# Patient Record
Sex: Female | Born: 1960 | Race: White | Hispanic: No | Marital: Married | Smoking: Never smoker
Health system: Southern US, Community
[De-identification: ages and names within clinical notes are randomized; demographics above are authoritative.]

## PROBLEM LIST (undated history)

## (undated) DIAGNOSIS — F329 Major depressive disorder, single episode, unspecified: Secondary | ICD-10-CM

## (undated) DIAGNOSIS — E785 Hyperlipidemia, unspecified: Secondary | ICD-10-CM

## (undated) DIAGNOSIS — F32A Depression, unspecified: Secondary | ICD-10-CM

## (undated) DIAGNOSIS — E079 Disorder of thyroid, unspecified: Secondary | ICD-10-CM

## (undated) HISTORY — DX: Hyperlipidemia, unspecified: E78.5

## (undated) HISTORY — DX: Depression, unspecified: F32.A

## (undated) HISTORY — PX: CERVICAL BIOPSY  W/ LOOP ELECTRODE EXCISION: SUR135

## (undated) HISTORY — DX: Major depressive disorder, single episode, unspecified: F32.9

## (undated) HISTORY — DX: Disorder of thyroid, unspecified: E07.9

---

## 1997-08-15 ENCOUNTER — Other Ambulatory Visit: Admission: RE | Admit: 1997-08-15 | Discharge: 1997-08-15 | Payer: Self-pay | Admitting: Obstetrics and Gynecology

## 1998-02-22 ENCOUNTER — Inpatient Hospital Stay (HOSPITAL_COMMUNITY): Admission: AD | Admit: 1998-02-22 | Discharge: 1998-02-24 | Payer: Self-pay | Admitting: Obstetrics and Gynecology

## 1998-11-23 ENCOUNTER — Other Ambulatory Visit: Admission: RE | Admit: 1998-11-23 | Discharge: 1998-11-23 | Payer: Self-pay | Admitting: *Deleted

## 1999-03-11 HISTORY — PX: TUBAL LIGATION: SHX77

## 1999-05-28 ENCOUNTER — Inpatient Hospital Stay (HOSPITAL_COMMUNITY): Admission: AD | Admit: 1999-05-28 | Discharge: 1999-05-29 | Payer: Self-pay | Admitting: Obstetrics and Gynecology

## 1999-09-03 ENCOUNTER — Ambulatory Visit (HOSPITAL_COMMUNITY): Admission: RE | Admit: 1999-09-03 | Discharge: 1999-09-03 | Payer: Self-pay | Admitting: Obstetrics and Gynecology

## 2000-02-28 ENCOUNTER — Other Ambulatory Visit: Admission: RE | Admit: 2000-02-28 | Discharge: 2000-02-28 | Payer: Self-pay | Admitting: Obstetrics and Gynecology

## 2003-06-26 ENCOUNTER — Other Ambulatory Visit: Admission: RE | Admit: 2003-06-26 | Discharge: 2003-06-26 | Payer: Self-pay | Admitting: Family Medicine

## 2003-11-17 ENCOUNTER — Encounter: Admission: RE | Admit: 2003-11-17 | Discharge: 2003-11-17 | Payer: Self-pay | Admitting: Family Medicine

## 2003-12-21 ENCOUNTER — Other Ambulatory Visit: Admission: RE | Admit: 2003-12-21 | Discharge: 2003-12-21 | Payer: Self-pay | Admitting: Family Medicine

## 2004-01-03 ENCOUNTER — Other Ambulatory Visit: Admission: RE | Admit: 2004-01-03 | Discharge: 2004-01-03 | Payer: Self-pay | Admitting: Obstetrics and Gynecology

## 2004-04-16 ENCOUNTER — Ambulatory Visit (HOSPITAL_COMMUNITY): Admission: RE | Admit: 2004-04-16 | Discharge: 2004-04-16 | Payer: Self-pay | Admitting: Obstetrics and Gynecology

## 2004-04-16 ENCOUNTER — Encounter (INDEPENDENT_AMBULATORY_CARE_PROVIDER_SITE_OTHER): Payer: Self-pay | Admitting: Specialist

## 2004-11-27 ENCOUNTER — Other Ambulatory Visit: Admission: RE | Admit: 2004-11-27 | Discharge: 2004-11-27 | Payer: Self-pay | Admitting: Obstetrics and Gynecology

## 2005-05-26 ENCOUNTER — Emergency Department (HOSPITAL_COMMUNITY): Admission: EM | Admit: 2005-05-26 | Discharge: 2005-05-26 | Payer: Self-pay | Admitting: Emergency Medicine

## 2005-11-13 ENCOUNTER — Encounter: Admission: RE | Admit: 2005-11-13 | Discharge: 2005-11-13 | Payer: Self-pay | Admitting: Family Medicine

## 2006-06-17 ENCOUNTER — Other Ambulatory Visit: Admission: RE | Admit: 2006-06-17 | Discharge: 2006-06-17 | Payer: Self-pay | Admitting: Family Medicine

## 2006-11-25 IMAGING — CR DG FACIAL BONES COMPLETE 3+V
7 series · 7 of 7 positions shown · non-contrast
Comparison: None.

CLINICAL DATA: Fell injuring left side of face. 
 FACIAL BONES ? 5 VIEW:

[[person_name]]
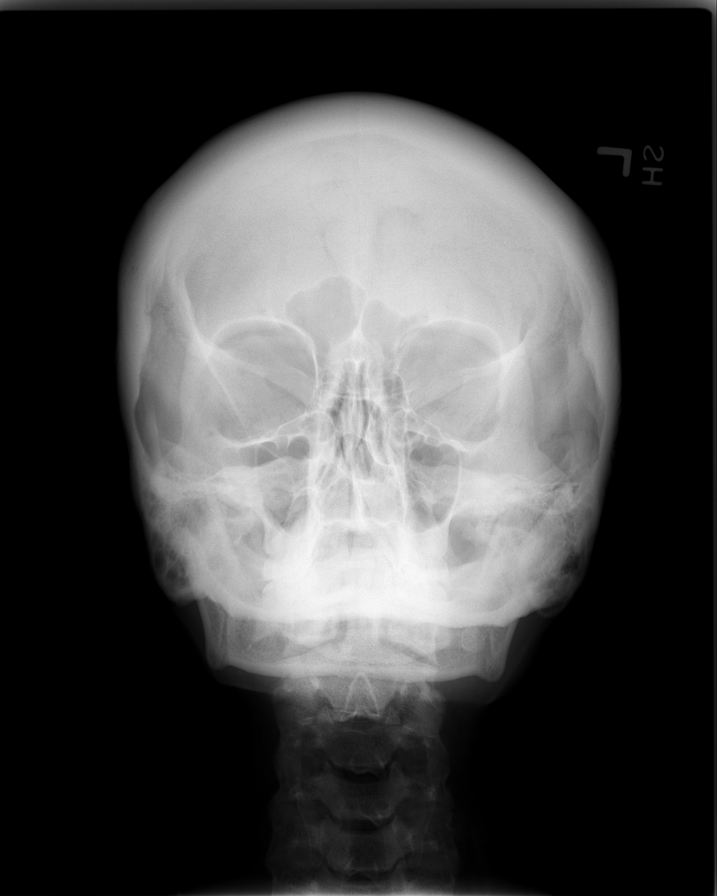

[w waters]
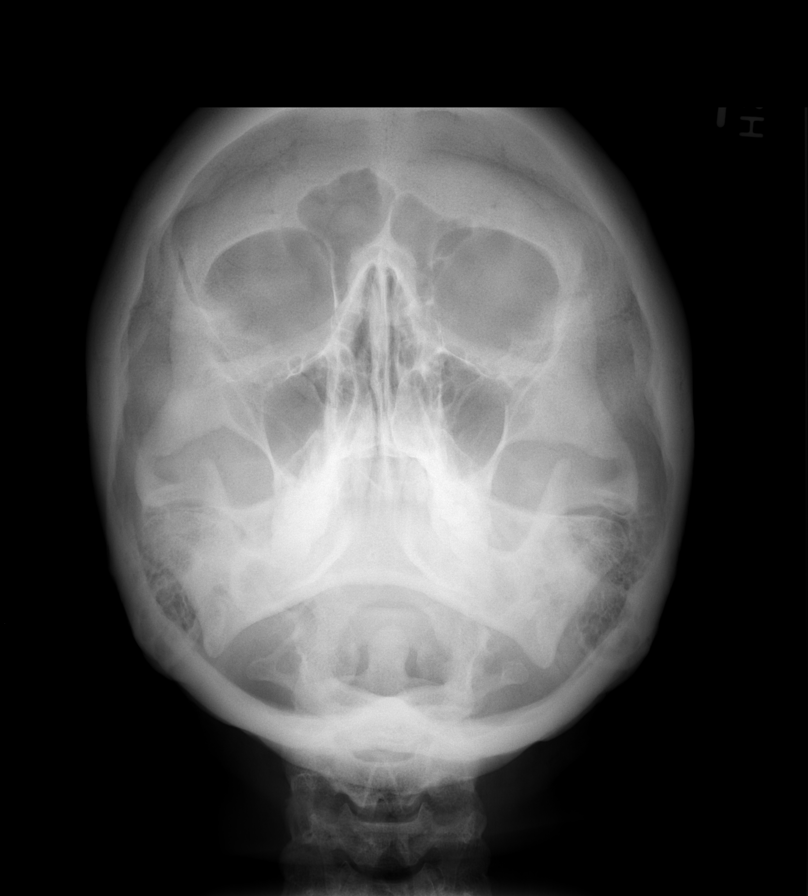

[w skull lat]
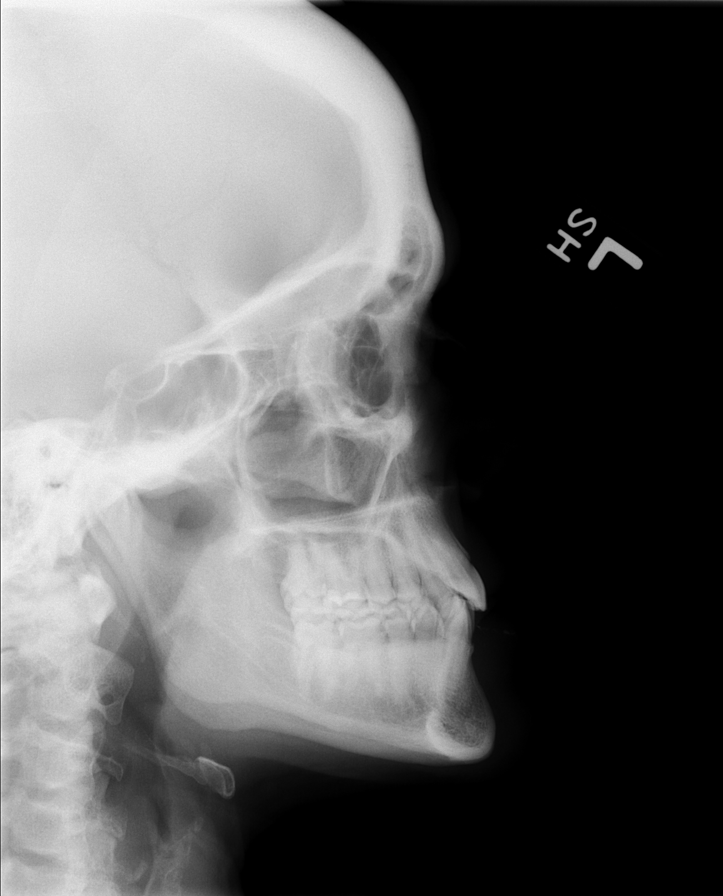

[w smv (1 of 3)]
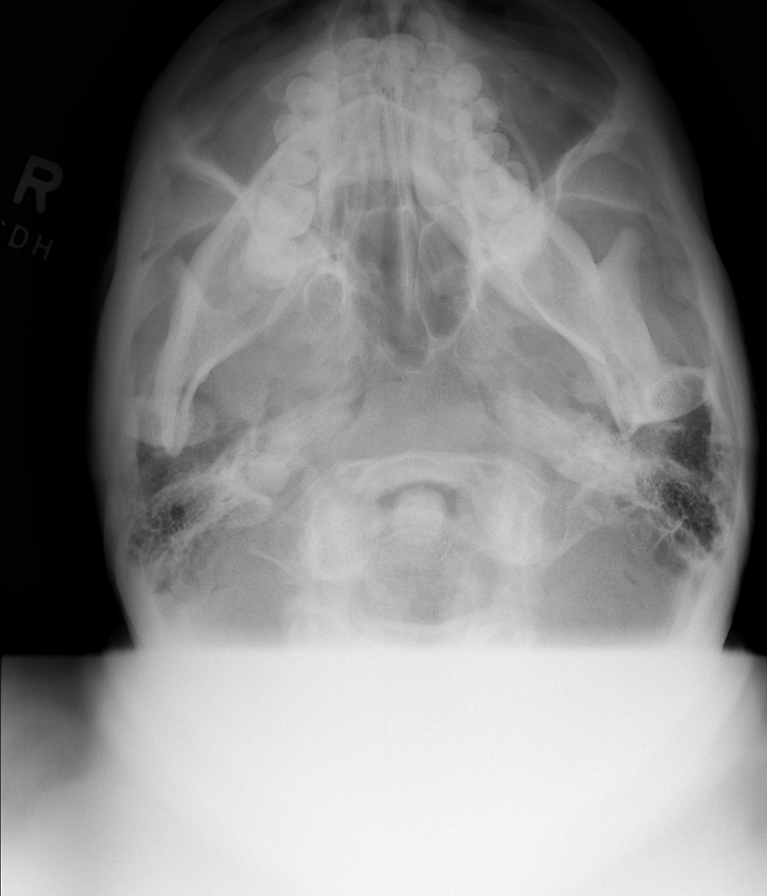

[w smv (2 of 3)]
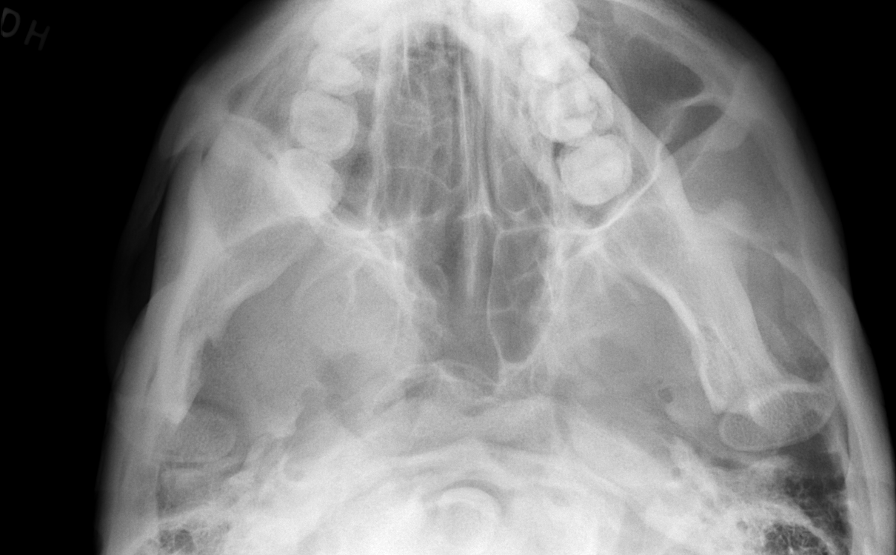

[w smv (3 of 3)]
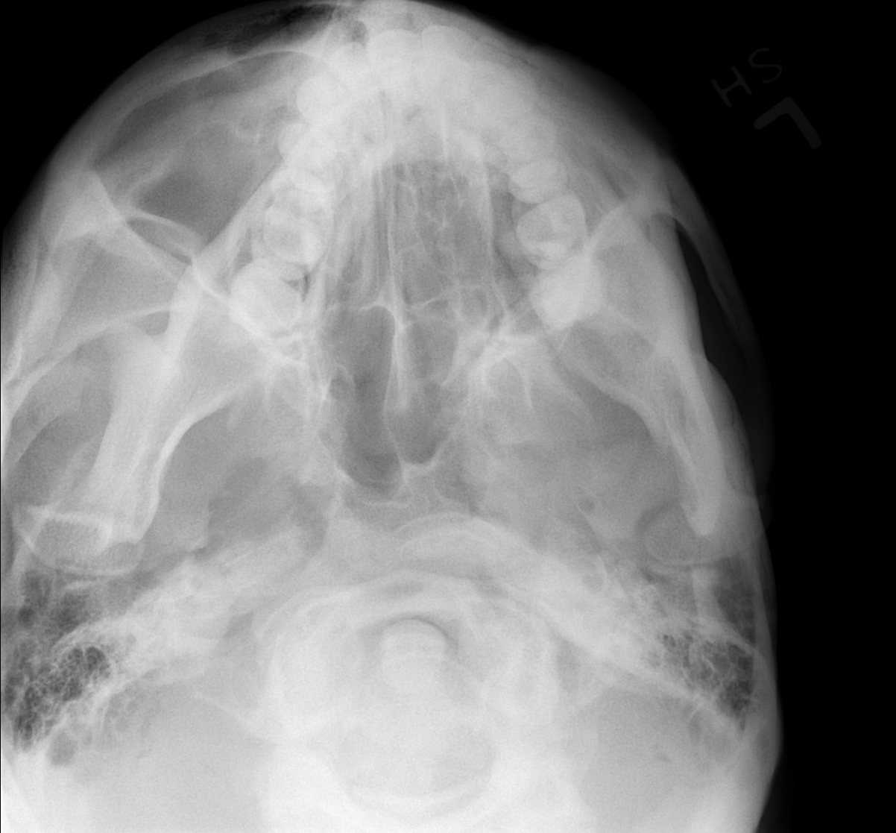

[w waters *]
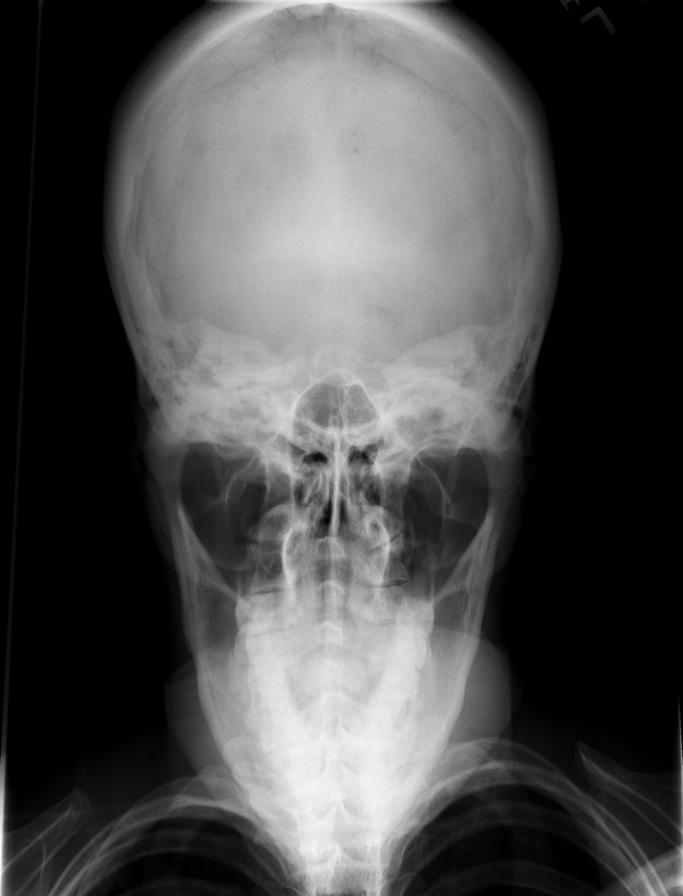

[7 of 7 positions shown; findings below may reference images not displayed]

FINDINGS: no obvious facial fractures.  No orbital emphysema. No fluid in the sinuses.
IMPRESSION: Normal.

## 2006-12-24 ENCOUNTER — Other Ambulatory Visit: Admission: RE | Admit: 2006-12-24 | Discharge: 2006-12-24 | Payer: Self-pay | Admitting: Family Medicine

## 2009-11-06 ENCOUNTER — Other Ambulatory Visit: Admission: RE | Admit: 2009-11-06 | Discharge: 2009-11-06 | Payer: Self-pay | Admitting: Family Medicine

## 2010-07-26 NOTE — H&P (Signed)
[facility]  Patient:    Vanessa Farrell, Vanessa Farrell                   MRN: [id number] Adm. Date:  [id number] Attending:  Leonard Schwartz Dictator:   Vance Gather Duplantis, C.N.M.                         History and Physical  HISTORY OF PRESENT ILLNESS:   Ms. Goodroe is a 50 year old married white female, gravida 5, para 4-0-0-4, who presents at 41-2/7 weeks complaining of uterine contractions every five minutes for the last several hours.  She denies any leaking or vaginal bleeding.  She reports positive fetal movement.  Her pregnancy has been followed at [facility] by the certified nurse midwife service and as been essentially uncomplicated though at risk for:  #1 - A history of advanced maternal age with no amniocentesis, #2 - history of rapid labor.  Her group B strep is negative.  OB/GYN HISTORY:               She is a gravida 5, para 4-0-0-4 who delivered a viable female infant in 60 who weighed 7 pounds 12 ounces at 40-weeks gestation  following a 12-hour labor without complication.  In 1993, she delivered another  viable female infant who weighed 7 pounds 12 ounces at 40-weeks gestation following a four-hour labor, also without complication.  In 1997, she delivered a viable female infant who weighed 7-1/2 pounds at 40-weeks gestation following a three-hour labor without complication except meconium-stained fluid and in 1999, she delivered a  viable female infant who weighed 9 pounds 6 ounces at 40-weeks gestation following a four-hour labor, also without complication.  ALLERGIES:                    She has no known drug allergies.  GENERAL MEDICAL HISTORY:      She reports having had the usual childhood diseases. She reports a history of anemia with previous pregnancies.  FAMILY HISTORY:               Her family history if significant for father, maternal grandmother and paternal grandmother with MIs, varicose veins with  her  mother, paternal grandmother with diabetes, mother with thyroidectomy, mother with lymphatic cancer, now treated with radiation.  GENETIC HISTORY:              Her genetic history is significant only for the fact that she is age 37 and declines an amniocentesis.  SOCIAL HISTORY:               She is married to Marisue Ivan, who is involved and supportive.  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 They are of the [facility] faith.  They deny any illicit drug use, alcohol or smoking with this pregnancy.  PRENATAL LABORATORY DATA:     Her blood type is O-positive.  Her antibody screen is negative.  Syphilis is nonreactive.  Rubella is immune.  Hepatitis B surface antigen is negative.  Pap is within normal limits.  One-hour glucola was elevated but her three-hour GTT was within normal limits.  Her 36-week beta strep was negative.  PHYSICAL EXAMINATION:  VITAL SIGNS:                  Her vital signs are stable.  She is afebrile.  HEENT:  Grossly within normal limits.  HEART:                        Regular rate and rhythm.  CHEST:                        Clear.  BREASTS:                      Soft and nontender.  ABDOMEN:                      Gravid with uterine contractions noted every three to five minutes.  Her fetal heart rate is reactive and reassuring.  PELVIC:                       Her pelvic exam on admission is 4 cm, 80% and vertex at a -1 station with intact membranes.  EXTREMITIES:                  Within normal limits.  ASSESSMENT:                   1. Intrauterine pregnancy at term.                               2. Active labor.                               3. Negative group B streptococcus.  PLAN:                         Her plan is to admit to labor and delivery, to follow routine C.N.M. orders and to notify Dr. Maris Berger. Haygood of patients admission. DD:  05/28/99 TD:  05/29/99 Job:  [id number]

## 2010-07-26 NOTE — H&P (Signed)
Vanessa Farrell, Vanessa Farrell            ACCOUNT NO.:  [id number]   MEDICAL RECORD NO.:  [id number]          PATIENT TYPE:  AMB   LOCATION:  SDC                           [facility]ILITY:  [facility]   PHYSICIAN:  Charles A. Delcambre, MDDATE OF BIRTH:  Feb 10, 1961   DATE OF ADMISSION:  DATE OF DISCHARGE:                                HISTORY & PHYSICAL   HISTORY:  A 50 year old para 5-0-0-5 admitted for an abnormal pap smear.  In  2003 she had and atypical smear and colposcopy with at 9:00 mild dysplasia.  ECC benign.  In 2004 atypical pap.  In 2004 low grade pap.  In April 2005  low grade pap.  In October 2005 high grade pap, CIN-II.  A colposcopy was  done with no lesions seen.  High risk HPV positive.  ECC scattered  dysplastic cells noted within the cervical mucosa.  An 11:00 biopsy random  showing low grade dysplasia.  She does have a tubal ligation and her husband  has had two vasectomies.   MEDICAL HISTORY:  1.  Anxiety.  2.  ADD.  3.  Hypothyroidism.   SURGICAL HISTORY:  1.  Tubal ligation.  2.  LASIK surgery.   MEDICATIONS:  1.  Effexor XR 35 mg once a day.  2.  Strattera 60 mg once a day.  3.  Synthroid dose not specified.   ALLERGIES:  No known drug allergies.   SOCIAL HISTORY:  No tobacco, ethanol, or drug use.  No SST exposure in the  past that she is aware of.  She is married and in a monogamous relationship  with her husband.   FAMILY HISTORY:  Father died at age 48 with coronary artery disease.  Mother  is 37.  Her brother is 72.  A sister is 30.  Otherwise no major illnesses.   REVIEW OF SYSTEMS:  No fever, chills, rashes, lesions, headaches, dizziness,  seasonal allergies, chest pain, shortness of breath, wheezing, constipation,  bleeding, diarrhea, melena, hematochezia, urgency, frequency, dysuria,  incontinency, hematuria, _____________ or emotional changes.   PHYSICAL EXAMINATION:  GENERAL:  Alert and oriented x3.  No distress.  VITAL SIGNS:  Blood pressure 120/78.   Weight 142 pounds.  Heart rate is 72.  Respirations 18.  HEENT:  Grossly within normal limits.  NECK:  Supple without thyromegaly or adenopathy.  LUNGS:  Clear bilaterally.  HEART:  Regular rate and rhythm without murmurs, rubs, or gallops.  BREASTS:  Deferred. Personal exam with PCP recently within the last several  months.  ABDOMEN:  Flat, soft, and nontender.  No hepatosplenomegaly or masses noted.  PELVIC:  Normal external female genitalia.  Bartholin's, urethra, and  Skene's within normal limits.  Vulva without discharge or lesions.  Multiparous cervix is noted.  Bimanual examination of the uterus anteverted,  six weeks size.  Adnexa nontender without masses bilaterally.  Ovaries  palpable and normal size bilaterally.   ASSESSMENT:  High grade dysplasia Pap.  Lesion not found on colposcopy.  No  lesions seen.  Biopsy low grade lesion.  Positive endocervical curettage.  Patient admitted for cold knife conization.   PLAN:  NPO past midnight the evening prior to surgery.  Preoperative CBC.  Status post BTL and vasectomy, we will forego getting HCG pregnancy test  done.  She accepts the risks of infection, bleeding, bowel, and bladder  damage, and risks of blood products to include hepatitis and HIV exposure.  All questions were answered and we will proceed as outlined.      CAD/MEDQ  D:  04/10/2004  T:  04/10/2004  Job:  [id number]

## 2010-07-26 NOTE — Op Note (Signed)
Vanessa Farrell, CRAMER            ACCOUNT NO.:  [id number]   MEDICAL RECORD NO.:  [id number]          PATIENT TYPE:  AMB   LOCATION:  SDC                           [facility]:  [facility]   PHYSICIAN:  Charles A. Delcambre, MDDATE OF BIRTH:  10-26-1960   DATE OF PROCEDURE:  04/16/2004  DATE OF DISCHARGE:                                 OPERATIVE REPORT   PREOPERATIVE DIAGNOSES:  1.  High-grade Papanicolaou smear.  2.  Inadequate colposcopy.  3.  Positive endocervical curettings.  4.  No lesion seen on colposcopy.   POSTOPERATIVE DIAGNOSES:  1.  High-grade Papanicolaou smear.  2.  Inadequate colposcopy.  3.  Positive endocervical curettings.  4.  No lesion seen on colposcopy.   PROCEDURES:  1.  Cold knife conization.  2.  Paracervical block.   SURGEON:  Charles A. Delcambre, MD   ASSISTANT:  None.   COMPLICATIONS:  None.   ESTIMATED BLOOD LOSS:  75 mL.   SPECIMENS:  Cone specimen, open at 12 o'clock.   ANESTHESIA:  General mask and IV sedation.   Instrument and sponge and needle count correct x2.   DESCRIPTION OF PROCEDURE:  The patient was taken to the operating room and  placed in supine position.  Anesthesia was dosed without difficulty.  The  patient was then placed in the dorsal lithotomy position, sterile prep and  drape was undertaken.  A weighted speculum was placed in the vagina.  The  cervix was grasped with a single-tooth tenaculum.  A paracervical block of  0.25% Marcaine with epinephrine was placed, a total of 10 mL divided at 4  and 8 o'clock.  Lugol's solution was placed.  A small amount of Lugol's  solution was noted faintly at 7 o'clock.  The cone specimen was excised with  a knife, encompassing this lesion widely.  A deep cone was taken.  A 0  Vicryl running suture was then placed around the cervical mucosal edge.  Several figure-of-eight 2-0 Vicryl sutures were  placed in the cone bed.  Electrocautery was used to achieve hemostasis.  Hemostasis was  adequate.  Monsel's solution was held in pressure.  Hemostasis was verified.  All the instruments were removed.  The patient was  taken to recovery with physician in attendance, having tolerated the  procedure well.      CAD/MEDQ  D:  04/16/2004  T:  04/16/2004  Job:  [id number]

## 2010-07-26 NOTE — Op Note (Signed)
[facility]  Patient:    Vanessa Farrell, Vanessa Farrell                   MRN: [id number] Proc. Date: 09/03/99 Adm. Date:  [id number] Attending:  Dierdre Forth Pearline                           Operative Report  PREOPERATIVE DIAGNOSIS:       Desire for surgical sterilization.  POSTOPERATIVE DIAGNOSIS:      Desire for surgical sterilization.  OPERATION:                    Laparoscopic tubal cautery.  SURGEON:                      Vanessa P. Pennie Rushing, M.D.  ANESTHESIA:                   General orotracheal.  ESTIMATED BLOOD LOSS:         Less than 25 cc.  COMPLICATIONS:                Bleeding from the cervix from the tenaculum entry.  FINDINGS:                     The uterus and tubes were within normal limits, as were the ovaries.  DESCRIPTION OF PROCEDURE:     The patient was taken to the operating room after appropriate identification and placed on the operating table.  After the attainment of adequate general anesthesia, she was placed in the modified lithotomy position.  The abdomen, perineum and vagina were prepped with multiple layers of Betadine.  A red Robinson catheter was used to empty the bladder.  A single tooth tenaculum was placed on the anterior cervix.  The abdomen was draped as a sterile field.  The subumbilical area was infiltrated with 5 cc of 0.25% Marcaine.  A subumbilical incision was made and the Veress cannula placed through that incision into the peritoneal cavity. Pneumoperitoneum was created with 3 L of CO2.  The Veress cannula was removed and a laparoscopic trocar placed through that incision into the peritoneal cavity.  The laparoscope was placed through the trocar sleeve.  The above noted findings were made.  The cautery mechanism was then placed through the operating channel of the laparoscope and the right fallopian tube identified, followed to its fimbriated end, the grasped at the isthmic portion and cauterized in two  adjacent areas.  A similar procedure was carried out on the opposite side and hemostasis was noted to be adequate.  All instruments were then removed from the peritoneal cavity under direct visualization as the CO2 was allowed to escape.  She subumbilical incision was closed with a subcuticular suture of 3-0 Vicryl.  The single tooth tenaculum was removed and a large amount of bleeding noted from the tenaculum entry on the left side.  A suture of 0 Vicryl in a figure-of-eight fashion allowed adequate hemostasis. The patient was then awakened from general anesthesia and taken to the recovery room in satisfactory condition, having tolerated the procedure well with sponge and instrument counts correct. DD:  09/03/99 TD:  09/04/99 Job: [id number]

## 2010-07-26 NOTE — H&P (Signed)
[facility]  Patient:    Vanessa Farrell, Vanessa Farrell                   MRN: [id number] Adm. Date:  [id number] Disc. Date: [id number] Attending:  Leonard Schwartz                         History and Physical  DATE OF BIRTH:                    Aug 15, 1960  HISTORY OF PRESENT ILLNESS:       The patient is a 50 year old, white, married female, para 5-0-0-5, who presents for surgical sterilization.  Her last menstrual period was prior to her last delivery which was on May 28, 1999. She wants no further children and, as a matter of fact, her husband had had a vasectomy prior to this pregnancy.  PAST MEDICAL HISTORY:             The patient has had anemia with previous pregnancies.  OBSTETRICAL/GYNECOLOGIC HISTORY:   She has had vaginal deliveries of five infants, the last one was on May 28, 1999.  These have been uncomplicated except for one with macrosomia.  CURRENT MEDICATIONS:              None.  ALLERGIES:                        Drug Sensitivities: None.  FAMILY HISTORY:                   Positive for myocardial infarction, diabetes, thyroid disease, lymphoma.  SOCIAL HISTORY:                   The patient is married and a full-time homemaker. She does not smoke, use alcohol or drugs.  REVIEW OF SYSTEMS:                Significant for breast feeding.  PHYSICAL EXAMINATION:  VITAL SIGNS:                      Blood pressure 104/76.  LUNGS:                            Clear.  HEART:                            Regular rate and rhythm.  PELVIC:                           EG/BUS within normal limits.  The vagina is rugae.  The cervix is without gross lesions.  Uterus is normal size, shape, consistency, anterior, mobile, and nontender.  Adnexa: No masses.  IMPRESSION:                       Desire for surgical sterilization status post vasectomy failure.  DISPOSITION:                      A long discussion is held with the  patient concerning the indications for the procedure as well as the risks involved which include but are not limited to anesthesia, bleeding, infection, damage to adjacent organs and subsequent pregnancy status post tubal ligation representing  tubal failure.  The patient seems to understand and wishes to proceed with tubal sterilization at [facility] on September 03, 1999. DD:  08/21/99 TD:  08/21/99 Job: [id number]

## 2010-12-20 ENCOUNTER — Other Ambulatory Visit: Payer: Self-pay | Admitting: Family Medicine

## 2010-12-20 DIAGNOSIS — Z1231 Encounter for screening mammogram for malignant neoplasm of breast: Secondary | ICD-10-CM

## 2011-01-14 ENCOUNTER — Ambulatory Visit
Admission: RE | Admit: 2011-01-14 | Discharge: 2011-01-14 | Disposition: A | Discharge status: Home / Self Care | Payer: BC Managed Care – PPO | Source: Ambulatory Visit | Attending: Family Medicine | Admitting: Family Medicine

## 2011-01-14 DIAGNOSIS — Z1231 Encounter for screening mammogram for malignant neoplasm of breast: Secondary | ICD-10-CM

## 2012-09-28 ENCOUNTER — Other Ambulatory Visit: Payer: Self-pay

## 2012-09-28 DIAGNOSIS — Z1231 Encounter for screening mammogram for malignant neoplasm of breast: Secondary | ICD-10-CM

## 2012-10-12 ENCOUNTER — Other Ambulatory Visit (HOSPITAL_COMMUNITY)
Admission: RE | Admit: 2012-10-12 | Discharge: 2012-10-12 | Disposition: A | Discharge status: Home / Self Care | Payer: BC Managed Care – PPO | Source: Ambulatory Visit | Attending: Obstetrics and Gynecology | Admitting: Obstetrics and Gynecology

## 2012-10-12 ENCOUNTER — Other Ambulatory Visit: Payer: Self-pay | Admitting: Nurse Practitioner

## 2012-10-12 DIAGNOSIS — Z1151 Encounter for screening for human papillomavirus (HPV): Secondary | ICD-10-CM | POA: Insufficient documentation

## 2012-10-12 DIAGNOSIS — Z01419 Encounter for gynecological examination (general) (routine) without abnormal findings: Secondary | ICD-10-CM | POA: Insufficient documentation

## 2012-10-13 ENCOUNTER — Ambulatory Visit
Admission: RE | Admit: 2012-10-13 | Discharge: 2012-10-13 | Disposition: A | Discharge status: Home / Self Care | Payer: BC Managed Care – PPO | Source: Ambulatory Visit

## 2012-10-13 DIAGNOSIS — Z1231 Encounter for screening mammogram for malignant neoplasm of breast: Secondary | ICD-10-CM

## 2015-01-15 ENCOUNTER — Encounter: Payer: Self-pay | Admitting: Internal Medicine

## 2015-01-15 ENCOUNTER — Ambulatory Visit (INDEPENDENT_AMBULATORY_CARE_PROVIDER_SITE_OTHER): Payer: PRIVATE HEALTH INSURANCE | Admitting: Internal Medicine

## 2015-01-15 VITALS — BP 120/78 | HR 58 | Temp 98.6°F | Resp 16 | Ht 62.75 in | Wt 157.0 lb

## 2015-01-15 DIAGNOSIS — E039 Hypothyroidism, unspecified: Secondary | ICD-10-CM | POA: Insufficient documentation

## 2015-01-15 DIAGNOSIS — F419 Anxiety disorder, unspecified: Secondary | ICD-10-CM | POA: Diagnosis not present

## 2015-01-15 DIAGNOSIS — E785 Hyperlipidemia, unspecified: Secondary | ICD-10-CM | POA: Insufficient documentation

## 2015-01-15 DIAGNOSIS — Z299 Encounter for prophylactic measures, unspecified: Secondary | ICD-10-CM | POA: Diagnosis not present

## 2015-01-15 DIAGNOSIS — F32A Depression, unspecified: Secondary | ICD-10-CM

## 2015-01-15 DIAGNOSIS — F329 Major depressive disorder, single episode, unspecified: Secondary | ICD-10-CM | POA: Diagnosis not present

## 2015-01-15 MED ORDER — LEVOTHYROXINE SODIUM 125 MCG PO TABS: 125.0000 ug | ORAL_TABLET | Freq: Every day | ORAL | Status: DC

## 2015-01-15 MED ORDER — VENLAFAXINE HCL 37.5 MG PO TABS: 37.5000 mg | ORAL_TABLET | Freq: Every day | ORAL | Status: DC

## 2015-01-15 NOTE — Assessment & Plan Note (Signed)
Thyroid function checked over the summer and was normal Continue current dose of levothyroxine-prescription sent to pharmacy

## 2015-01-15 NOTE — Assessment & Plan Note (Signed)
She was told her cholesterol was slightly elevated at the last checked over the summer She is working on weight loss Exercises regularly and eats healthy We'll recheck next summer

## 2015-01-15 NOTE — Assessment & Plan Note (Signed)
Well controlled, stable Continue current dose of effexor 

## 2015-01-15 NOTE — Patient Instructions (Addendum)
   All other Health Maintenance issues reviewed.   All recommended immunizations and age-appropriate screenings are up-to-date.  No immunizations administered today.   Medications reviewed and updated.  No changes recommended at this time.  Your prescription(s) have been submitted to your pharmacy. Please take as directed and contact our office if you believe you are having problem(s) with the medication(s).  A referral to GI has ordered.

## 2015-01-15 NOTE — Assessment & Plan Note (Signed)
Well controlled, stable Continue current dose of effexor

## 2015-01-15 NOTE — Progress Notes (Signed)
Subjective:    Patient ID: Vanessa Farrell, female    DOB: 11/02/1960, 54 y.o.   MRN: [id number]  HPI She is here to establish with a new pcp. She has no concerns.  She needs prescriptions.   Depression, anxiety: She is taking her medication daily as prescribed. She denies any side effects from the medication. She feels her depression and anxiety are well controlled and she is happy with her current dose of medication.   Hypothyroidism:  She is taking her medication daily.  She denies any recent changes in energy or weight that are unexplained. She thinks her thyroid function was last checked in July.  Hyperlipidemia:  She has a family history.  She was told her cholesterol was a little elevated.  She eats relatively healthy. She exercises regularly.     Medications and allergies reviewed with patient and updated if appropriate.  Patient Active Problem List   Diagnosis Date Noted  . Hypothyroidism 01/15/2015  . Depression 01/15/2015  . Anxiety 01/15/2015    Past Medical History  Diagnosis Date  . Depression   . Thyroid disease     hypothyroidism  . Hyperlipidemia     Past Surgical History  Procedure Laterality Date  . Cervical biopsy  w/ loop electrode excision      Social History   Social History  . Marital Status: Married    Spouse Name: N/A  . Number of Children: N/A  . Years of Education: N/A   Social History Main Topics  . Smoking status: Never Smoker   . Smokeless tobacco: Never Used  . Alcohol Use: Yes  . Drug Use: No  . Sexual Activity: Not Asked   Other Topics Concern  . None   Social History Narrative  . None    Review of Systems  Constitutional: Negative for fever, chills, appetite change, fatigue and unexpected weight change.  HENT: Positive for hearing loss (left > right).   Eyes: Negative for visual disturbance.  Respiratory: Negative for cough, shortness of breath and wheezing.   Cardiovascular: Negative.   Gastrointestinal:  Negative for nausea, abdominal pain, diarrhea, constipation and blood in stool.       Occasional GERD  Genitourinary: Negative for dysuria and hematuria.  Musculoskeletal: Negative for myalgias, back pain and arthralgias.  Neurological: Negative for dizziness, numbness and headaches.  Psychiatric/Behavioral: Positive for dysphoric mood. The patient is nervous/anxious.        Objective:   Filed Vitals:   01/15/15 1308  BP: 120/78  Pulse: 58  Temp: 98.6 F (37 C)  Resp: 16   Filed Weights   01/15/15 1308  Weight: 157 lb (71.215 kg)   Body mass index is 28.03 kg/(m^2).   Physical Exam  Constitutional: She is oriented to person, place, and time. She appears well-developed and well-nourished. No distress.  HENT:  Head: Normocephalic and atraumatic.  Right Ear: External ear normal.  Left Ear: External ear normal.  Mouth/Throat: Oropharynx is clear and moist.  Eyes: Conjunctivae are normal.  Neck: Neck supple. No tracheal deviation present. No thyromegaly present.  No carotid bruit  Cardiovascular: Normal rate, regular rhythm and normal heart sounds.   No murmur heard. Pulmonary/Chest: Effort normal and breath sounds normal. No respiratory distress. She has no wheezes.  Abdominal: Soft. She exhibits no distension. There is no tenderness.  Musculoskeletal: She exhibits no edema.  Lymphadenopathy:    She has no cervical adenopathy.  Neurological: She is alert and oriented to person, place, and time.  Skin: Skin is warm and dry.  Psychiatric: She has a normal mood and affect. Her behavior is normal.          Assessment & Plan:   See Problem List.  Referred to GI for a screening colonoscopy  Follow up annually for PE  Prescriptions sent to pharmacy

## 2015-01-15 NOTE — Progress Notes (Signed)
Pre visit review using our clinic review tool, if applicable. No additional management support is needed unless otherwise documented below in the visit note. 

## 2015-02-12 ENCOUNTER — Ambulatory Visit: Payer: PRIVATE HEALTH INSURANCE | Admitting: Internal Medicine

## 2016-01-20 ENCOUNTER — Other Ambulatory Visit: Payer: Self-pay | Admitting: Internal Medicine

## 2016-02-13 ENCOUNTER — Encounter: Payer: Self-pay | Admitting: Internal Medicine

## 2016-02-13 ENCOUNTER — Ambulatory Visit (INDEPENDENT_AMBULATORY_CARE_PROVIDER_SITE_OTHER): Payer: PRIVATE HEALTH INSURANCE | Admitting: Internal Medicine

## 2016-02-13 VITALS — BP 132/88 | HR 68 | Temp 98.0°F | Resp 16 | Ht 63.0 in | Wt 164.0 lb

## 2016-02-13 DIAGNOSIS — F329 Major depressive disorder, single episode, unspecified: Secondary | ICD-10-CM

## 2016-02-13 DIAGNOSIS — Z0000 Encounter for general adult medical examination without abnormal findings: Secondary | ICD-10-CM

## 2016-02-13 DIAGNOSIS — E039 Hypothyroidism, unspecified: Secondary | ICD-10-CM | POA: Diagnosis not present

## 2016-02-13 DIAGNOSIS — F419 Anxiety disorder, unspecified: Secondary | ICD-10-CM | POA: Diagnosis not present

## 2016-02-13 DIAGNOSIS — F32A Depression, unspecified: Secondary | ICD-10-CM

## 2016-02-13 DIAGNOSIS — Z1211 Encounter for screening for malignant neoplasm of colon: Secondary | ICD-10-CM

## 2016-02-13 MED ORDER — VENLAFAXINE HCL ER 37.5 MG PO CP24: 37.5000 mg | ORAL_CAPSULE | Freq: Every day | ORAL | 5 refills | Status: DC

## 2016-02-13 MED ORDER — LEVOTHYROXINE SODIUM 125 MCG PO TABS: ORAL_TABLET | ORAL | 5 refills | Status: DC

## 2016-02-13 NOTE — Assessment & Plan Note (Signed)
Check tsh  Titrate med dose if needed  

## 2016-02-13 NOTE — Progress Notes (Signed)
Pre visit review using our clinic review tool, if applicable. No additional management support is needed unless otherwise documented below in the visit note. 

## 2016-02-13 NOTE — Patient Instructions (Addendum)
[facility] [address] [place] [id number] Across the street from [facility] [phone]  [facility] [address]  [place], [place]  [phone]   Test(s) ordered today. Your results will be released to MyChart (or called to you) after review, usually within 72hours after test completion. If any changes need to be made, you will be notified at that same time.  All other Health Maintenance issues reviewed.   All recommended immunizations and age-appropriate screenings are up-to-date or discussed.  No immunizations administered today.   Medications reviewed and updated.  No changes recommended at this time.  Your prescription(s) have been submitted to your pharmacy. Please take as directed and contact our office if you believe you are having problem(s) with the medication(s).   A referral was ordered for GI for a colonoscopy - they will contact you to schedule this.   Please followup in one year

## 2016-02-13 NOTE — Assessment & Plan Note (Signed)
Controlled, stable Continue current dose of medication  

## 2016-02-13 NOTE — Progress Notes (Signed)
Subjective:    Patient ID: Vanessa Farrell, female    DOB: 1960/09/14, 55 y.o.   MRN: [id number]  HPI She is here for a physical exam.   She denies concerns.  She denies any changes in her history since she was here last.    Depression, anxiety: She is taking her medication daily as prescribed. She denies any side effects from the medication. She feels her depression is well controlled and she is happy with her current dose of medication.   Hypothyroidism:  She is taking her medication daily.  She denies any recent changes in energy or weight that are unexplained.    Medications and allergies reviewed with patient and updated if appropriate.  Patient Active Problem List   Diagnosis Date Noted  . Hypothyroidism 01/15/2015  . Depression 01/15/2015  . Anxiety 01/15/2015  . Hyperlipidemia 01/15/2015    Current Outpatient Prescriptions on File Prior to Visit  Medication Sig Dispense Refill  . SYNTHROID 125 MCG tablet TAKE 1 TABLET ONCE DAILY BEFORE BREAKFAST. 30 tablet 0  . venlafaxine XR (EFFEXOR-XR) 37.5 MG 24 hr capsule TAKE 1 CAPSULE DAILY WITH FOOD. 30 capsule 0   No current facility-administered medications on file prior to visit.     Past Medical History:  Diagnosis Date  . Depression   . Hyperlipidemia   . Thyroid disease    hypothyroidism    Past Surgical History:  Procedure Laterality Date  . CERVICAL BIOPSY  W/ LOOP ELECTRODE EXCISION      Social History   Social History  . Marital status: Married    Spouse name: N/A  . Number of children: N/A  . Years of education: N/A   Social History Main Topics  . Smoking status: Never Smoker  . Smokeless tobacco: Never Used  . Alcohol use Yes  . Drug use: No  . Sexual activity: Not on file   Other Topics Concern  . Not on file   Social History Narrative  . No narrative on file    Family History  Problem Relation Age of Onset  . Hyperlipidemia Father   . Heart disease Father   . Diabetes Father     . Heart disease Maternal Grandfather   . Heart disease Paternal Grandmother     Review of Systems  Constitutional: Negative for appetite change, chills, fatigue, fever and unexpected weight change.  Eyes: Negative for visual disturbance.  Respiratory: Negative for cough, shortness of breath and wheezing.   Cardiovascular: Positive for leg swelling (occ). Negative for chest pain and palpitations.  Gastrointestinal: Negative for abdominal pain, blood in stool, constipation, diarrhea and nausea.       No gerd  Genitourinary: Negative for dysuria and hematuria.  Musculoskeletal: Positive for arthralgias (left shoulder, right foot pain). Negative for back pain and myalgias.  Skin: Negative for color change and rash.  Neurological: Negative for dizziness, light-headedness and headaches.  Psychiatric/Behavioral: Positive for dysphoric mood (controlled  ). The patient is nervous/anxious (controlled).        Objective:   Vitals:   02/13/16 1540  BP: 132/88  Pulse: 68  Resp: 16  Temp: 98 F (36.7 C)   Filed Weights   02/13/16 1540  Weight: 164 lb (74.4 kg)   Body mass index is 29.05 kg/m.   Physical Exam Constitutional: She appears well-developed and well-nourished. No distress.  HENT:  Head: Normocephalic and atraumatic.  Right Ear: External ear normal. Normal ear canal and TM Left Ear: External ear normal.  Normal ear canal and TM Mouth/Throat: Oropharynx is clear and moist.  Eyes: Conjunctivae and EOM are normal.  Neck: Neck supple. No tracheal deviation present. No thyromegaly present.  No carotid bruit  Cardiovascular: Normal rate, regular rhythm and normal heart sounds.   No murmur heard.  No edema. Pulmonary/Chest: Effort normal and breath sounds normal. No respiratory distress. She has no wheezes. She has no rales.  Breast: deferred to Gyn Abdominal: Soft. She exhibits no distension. There is no tenderness.  Lymphadenopathy: She has no cervical adenopathy.  Skin:  Skin is warm and dry. She is not diaphoretic.  Psychiatric: She has a normal mood and affect. Her behavior is normal.         Assessment & Plan:   Physical exam: Screening blood work ordered Immunizations  Deferred flu vaccine, ? Last td - will defer today Colonoscopy - referred today Mammogram - will schedule Gyn  - will schedule Eye exams  Up to date  Exercise - regular Weight - working on weight loss - has some some weight since last year Skin  - no concerns Substance abuse  none  See Problem List for Assessment and Plan of chronic medical problems.  F/u annually

## 2016-02-20 ENCOUNTER — Encounter: Payer: Self-pay | Admitting: Gastroenterology

## 2016-03-28 ENCOUNTER — Telehealth: Payer: Self-pay | Admitting: Internal Medicine

## 2016-03-28 NOTE — Telephone Encounter (Signed)
Rec'd from [facility] forward 11 pages to Dr.Burns

## 2016-04-08 ENCOUNTER — Ambulatory Visit (AMBULATORY_SURGERY_CENTER): Payer: Self-pay

## 2016-04-08 VITALS — Ht 62.0 in | Wt 165.4 lb

## 2016-04-08 DIAGNOSIS — Z1211 Encounter for screening for malignant neoplasm of colon: Secondary | ICD-10-CM

## 2016-04-08 MED ORDER — NA SULFATE-K SULFATE-MG SULF 17.5-3.13-1.6 GM/177ML PO SOLN: ORAL | 0 refills | Status: DC

## 2016-04-08 NOTE — Progress Notes (Signed)
Per pt, no allergies to soy or egg products.Pt not taking any weight loss meds or using  O2 at home. 

## 2016-04-22 ENCOUNTER — Ambulatory Visit (AMBULATORY_SURGERY_CENTER): Payer: PRIVATE HEALTH INSURANCE | Admitting: Gastroenterology

## 2016-04-22 ENCOUNTER — Encounter: Payer: Self-pay | Admitting: Gastroenterology

## 2016-04-22 VITALS — BP 99/58 | HR 65 | Temp 96.7°F | Resp 11 | Ht 62.0 in | Wt 165.0 lb

## 2016-04-22 DIAGNOSIS — Z1211 Encounter for screening for malignant neoplasm of colon: Secondary | ICD-10-CM

## 2016-04-22 DIAGNOSIS — Z1212 Encounter for screening for malignant neoplasm of rectum: Secondary | ICD-10-CM

## 2016-04-22 MED ORDER — SODIUM CHLORIDE 0.9 % IV SOLN
500.0000 mL | INTRAVENOUS | Status: DC
Start: 2016-04-22 — End: 2017-05-20

## 2016-04-22 NOTE — Op Note (Signed)
[facility] Patient Name: Vanessa Rothmanatricia Fuentes Procedure Date: 04/22/2016 10:21 AM MRN: [id number] Endoscopist: Sherilyn CooterHenry L. Myrtie Neitheranis , MD Age: [age] Referring MD:  Date of Birth: [birth year] Gender: Female Account #: [id number] Procedure:                Colonoscopy Indications:              Screening for colorectal malignant neoplasm, This                            is the patient's first colonoscopy Medicines:                Monitored Anesthesia Care Procedure:                Pre-Anesthesia Assessment:                           - Prior to the procedure, a History and Physical                            was performed, and patient medications and                            allergies were reviewed. The patient's tolerance of                            previous anesthesia was also reviewed. The risks                            and benefits of the procedure and the sedation                            options and risks were discussed with the patient.                            All questions were answered, and informed consent                            was obtained. Prior Anticoagulants: The patient has                            taken no previous anticoagulant or antiplatelet                            agents. ASA Grade Assessment: II - A patient with                            mild systemic disease. After reviewing the risks                            and benefits, the patient was deemed in                            satisfactory condition to undergo the procedure.  After obtaining informed consent, the colonoscope                            was passed under direct vision. Throughout the                            procedure, the patient's blood pressure, pulse, and                            oxygen saturations were monitored continuously. The                            Model CF-HQ190L [id number]) scope was introduced                            through the anus and  advanced to the the cecum,                            identified by appendiceal orifice and ileocecal                            valve. The ileocecal valve, appendiceal orifice,                            and rectum were photographed. The quality of the                            bowel preparation was excellent. The colonoscopy                            was performed without difficulty. The patient                            tolerated the procedure well. The bowel preparation                            used was SUPREP. The quality of the bowel                            preparation was evaluated using the BBPS [facility]                            Bowel Preparation Scale) with scores of: Right                            Colon = 3, Transverse Colon = 3 and Left Colon = 3                            (entire mucosa seen well with no residual staining,                            small fragments of stool or opaque liquid). The  total BBPS score equals 9. Scope In: 10:33:00 AM Scope Out: 10:44:03 AM Scope Withdrawal Time: 0 hours 8 minutes 29 seconds  Total Procedure Duration: 0 hours 11 minutes 3 seconds  Findings:                 The entire examined colon appeared normal on direct                            and retroflexion views. Complications:            No immediate complications. Estimated blood loss:                            None. Estimated Blood Loss:     Estimated blood loss: none. Recommendation:           - Repeat colonoscopy in 10 years for screening                            purposes.                           - Patient has a contact number available for                            emergencies. The signs and symptoms of potential                            delayed complications were discussed with the                            patient. Return to normal activities tomorrow.                            Written discharge instructions were provided to the                             patient.                           - Resume previous diet.                           - Continue present medications. Rushil Kimbrell L. Myrtie Neither, MD 04/22/2016 10:47:00 AM This report has been signed electronically.

## 2016-04-22 NOTE — Patient Instructions (Signed)
YOU HAD AN ENDOSCOPIC PROCEDURE TODAY AT THE [facility]:   Refer to the procedure report that was given to you for any specific questions about what was found during the examination.  If the procedure report does not answer your questions, please call your gastroenterologist to clarify.  If you requested that your care partner not be given the details of your procedure findings, then the procedure report has been included in a sealed envelope for you to review at your convenience later.  YOU SHOULD EXPECT: Some feelings of bloating in the abdomen. Passage of more gas than usual.  Walking can help get rid of the air that was put into your GI tract during the procedure and reduce the bloating. If you had a lower endoscopy (such as a colonoscopy or flexible sigmoidoscopy) you may notice spotting of blood in your stool or on the toilet paper. If you underwent a bowel prep for your procedure, you may not have a normal bowel movement for a few days.  Please Note:  You might notice some irritation and congestion in your nose or some drainage.  This is from the oxygen used during your procedure.  There is no need for concern and it should clear up in a day or so.  SYMPTOMS TO REPORT IMMEDIATELY:   Following lower endoscopy (colonoscopy or flexible sigmoidoscopy):  Excessive amounts of blood in the stool  Significant tenderness or worsening of abdominal pains  Swelling of the abdomen that is new, acute  Fever of 100F or higher   For urgent or emergent issues, a gastroenterologist can be reached at any hour by calling [phone].   DIET:  We do recommend a small meal at first, but then you may proceed to your regular diet.  Drink plenty of fluids but you should avoid alcoholic beverages for 24 hours.  ACTIVITY:  You should plan to take it easy for the rest of today and you should NOT DRIVE or use heavy machinery until tomorrow (because of the sedation medicines used during the test).     FOLLOW UP: Our staff will call the number listed on your records the next business day following your procedure to check on you and address any questions or concerns that you may have regarding the information given to you following your procedure. If we do not reach you, we will leave a message.  However, if you are feeling well and you are not experiencing any problems, there is no need to return our call.  We will assume that you have returned to your regular daily activities without incident.  If any biopsies were taken you will be contacted by phone or by letter within the next 1-3 weeks.  Please call us at [phone] if you have not heard about the biopsies in 3 weeks.    SIGNATURES/CONFIDENTIALITY: You and/or your care partner have signed paperwork which will be entered into your electronic medical record.  These signatures attest to the fact that that the information above on your After Visit Summary has been reviewed and is understood.  Full responsibility of the confidentiality of this discharge information lies with you and/or your care-partner.   Resume medications.  

## 2016-04-22 NOTE — Progress Notes (Signed)
Report given to PACU, vss 

## 2016-04-23 ENCOUNTER — Telehealth: Payer: Self-pay

## 2016-04-23 NOTE — Telephone Encounter (Signed)
  Follow up Call-  Call back number 04/22/2016  Post procedure Call Back phone  # [phone]  Permission to leave phone message Yes  Some recent data might be hidden     Patient questions:  Do you have a fever, pain , or abdominal swelling? No. Pain Score  0 *  Have you tolerated food without any problems? Yes.    Have you been able to return to your normal activities? Yes.    Do you have any questions about your discharge instructions: Diet   No. Medications  No. Follow up visit  No.  Do you have questions or concerns about your Care? No.  Actions: * If pain score is 4 or above: No action needed, pain <4.

## 2016-09-28 ENCOUNTER — Other Ambulatory Visit: Payer: Self-pay | Admitting: Internal Medicine

## 2017-05-04 ENCOUNTER — Other Ambulatory Visit: Payer: Self-pay | Admitting: Internal Medicine

## 2017-05-20 NOTE — Progress Notes (Signed)
Subjective:    Patient ID: Vanessa Farrell, female    DOB: 24-Aug-1960, 57 y.o.   MRN: [id number]  HPI She is here for a physical exam.   She is menopausal and is having hot flashes.  She otherwise feels good and has no questions.  Up-to-date-we will schedule  Medications and allergies reviewed with patient and updated if appropriate.  Patient Active Problem List   Diagnosis Date Noted  . Hypothyroidism 01/15/2015  . Depression 01/15/2015  . Anxiety 01/15/2015  . Hyperlipidemia 01/15/2015    Current Outpatient Medications on File Prior to Visit  Medication Sig Dispense Refill  . levothyroxine (SYNTHROID) 125 MCG tablet Take 1 tablet (125 mcg total) by mouth daily before breakfast. -- Office visit needed for further refills 30 tablet 0  . venlafaxine XR (EFFEXOR-XR) 37.5 MG 24 hr capsule Take 1 capsule (37.5 mg total) by mouth daily. with food -- Office visit needed for further refills 30 capsule 0   No current facility-administered medications on file prior to visit.     Past Medical History:  Diagnosis Date  . Depression   . Hyperlipidemia   . Thyroid disease    hypothyroidism    Past Surgical History:  Procedure Laterality Date  . CERVICAL BIOPSY  W/ LOOP ELECTRODE EXCISION    . TUBAL LIGATION  2001    Social History   Socioeconomic History  . Marital status: Married    Spouse name: None  . Number of children: None  . Years of education: None  . Highest education level: None  Social Needs  . Financial resource strain: None  . Food insecurity - worry: None  . Food insecurity - inability: None  . Transportation needs - medical: None  . Transportation needs - non-medical: None  Occupational History  . None  Tobacco Use  . Smoking status: Never Smoker  . Smokeless tobacco: Never Used  Substance and Sexual Activity  . Alcohol use: Yes    Alcohol/week: 1.8 oz    Types: 3 Glasses of wine per week  . Drug use: No  . Sexual activity: None  Other Topics  Concern  . None  Social History Narrative  . None    Family History  Problem Relation Age of Onset  . Hyperlipidemia Father   . Heart disease Father   . Diabetes Father   . Heart disease Maternal Grandfather   . Heart disease Paternal Grandmother     Review of Systems  Constitutional: Negative for chills and fever.  Eyes: Negative for visual disturbance.  Respiratory: Negative for cough, shortness of breath and wheezing.   Cardiovascular: Negative for chest pain, palpitations and leg swelling.  Gastrointestinal: Negative for abdominal pain, blood in stool, constipation, diarrhea and nausea.       Increased gas  Genitourinary: Negative for dysuria and hematuria.  Musculoskeletal: Negative for arthralgias and back pain.  Skin: Negative for color change and rash.  Neurological: Negative for light-headedness and headaches.  Psychiatric/Behavioral: Negative for dysphoric mood. The patient is not nervous/anxious.        Objective:   Vitals:   05/21/17 1044  BP: 114/78  Pulse: 61  Resp: 16  Temp: 97.8 F (36.6 C)  SpO2: 98%   Filed Weights   05/21/17 1044  Weight: 157 lb (71.2 kg)   Body mass index is 28.72 kg/m.  Wt Readings from Last 3 Encounters:  05/21/17 157 lb (71.2 kg)  04/22/16 165 lb (74.8 kg)  04/08/16 165 lb 6.4  oz (75 kg)     Physical Exam Constitutional: She appears well-developed and well-nourished. No distress.  HENT:  Head: Normocephalic and atraumatic.  Right Ear: External ear normal. Normal ear canal and TM Left Ear: External ear normal.  Normal ear canal and TM Mouth/Throat: Oropharynx is clear and moist.  Eyes: Conjunctivae and EOM are normal.  Neck: Neck supple. No tracheal deviation present. No thyromegaly present.  No carotid bruit  Cardiovascular: Normal rate, regular rhythm and normal heart sounds.   No murmur heard.  No edema. Pulmonary/Chest: Effort normal and breath sounds normal. No respiratory distress. She has no wheezes. She  has no rales.  Breast: deferred to Gyn Abdominal: Soft. She exhibits no distension. There is no tenderness.  Lymphadenopathy: She has no cervical adenopathy.  Skin: Skin is warm and dry. She is not diaphoretic.  Psychiatric: She has a normal mood and affect. Her behavior is normal.        Assessment & Plan:   Physical exam: Screening blood work   ordered Immunizations  tdap today, others up to date Colonoscopy   Up to date  Mammogram-not up-to-date encouraged to schedule Gyn - not up-to-date-encouraged to schedule Eye exams   up-to-date EKG  None on file Exercise   regular-teaches exercise classes Weight-working on weight loss Skin-no concerns Substance abuse   none  See Problem List for Assessment and Plan of chronic medical problems.    Follow-up  annually

## 2017-05-20 NOTE — Patient Instructions (Addendum)
Test(s) ordered today. Your results will be released to [place] (or called to you) after review, usually within 72hours after test completion. If any changes need to be made, you will be notified at that same time.  All other Health Maintenance issues reviewed.   All recommended immunizations and age-appropriate screenings are up-to-date or discussed.  Tetanus immunization administered today.   Medications reviewed and updated.  No changes recommended at this time.  Your prescription(s) have been submitted to your pharmacy. Please take as directed and contact our office if you believe you are having problem(s) with the medication(s).    Please followup in one year   Health Maintenance, Female Adopting a healthy lifestyle and getting preventive care can go a long way to promote health and wellness. Talk with your health care provider about what schedule of regular examinations is right for you. This is a good chance for you to check in with your provider about disease prevention and staying healthy. In between checkups, there are plenty of things you can do on your own. Experts have done a lot of research about which lifestyle changes and preventive measures are most likely to keep you healthy. Ask your health care provider for more information. Weight and diet Eat a healthy diet  Be sure to include plenty of vegetables, fruits, low-fat dairy products, and lean protein.  Do not eat a lot of foods high in solid fats, added sugars, or salt.  Get regular exercise. This is one of the most important things you can do for your health. ? Most adults should exercise for at least 150 minutes each week. The exercise should increase your heart rate and make you sweat (moderate-intensity exercise). ? Most adults should also do strengthening exercises at least twice a week. This is in addition to the moderate-intensity exercise.  Maintain a healthy weight  Body mass index (BMI) is a measurement  that can be used to identify possible weight problems. It estimates body fat based on height and weight. Your health care provider can help determine your BMI and help you achieve or maintain a healthy weight.  For females 29 years of age and older: ? A BMI below 18.5 is considered underweight. ? A BMI of 18.5 to 24.9 is normal. ? A BMI of 25 to 29.9 is considered overweight. ? A BMI of 30 and above is considered obese.  Watch levels of cholesterol and blood lipids  You should start having your blood tested for lipids and cholesterol at 55 years of age, then have this test every 5 years.  You may need to have your cholesterol levels checked more often if: ? Your lipid or cholesterol levels are high. ? You are older than 55 years of age. ? You are at high risk for heart disease.  Cancer screening Lung Cancer  Lung cancer screening is recommended for adults 69-61 years old who are at high risk for lung cancer because of a history of smoking.  A yearly low-dose CT scan of the lungs is recommended for people who: ? Currently smoke. ? Have quit within the past 15 years. ? Have at least a 30-pack-year history of smoking. A pack year is smoking an average of one pack of cigarettes a day for 1 year.  Yearly screening should continue until it has been 15 years since you quit.  Yearly screening should stop if you develop a health problem that would prevent you from having lung cancer treatment.  Breast Cancer  Practice breast  self-awareness. This means understanding how your breasts normally appear and feel.  It also means doing regular breast self-exams. Let your health care provider know about any changes, no matter how small.  If you are in your 20s or 30s, you should have a clinical breast exam (CBE) by a health care provider every 1-3 years as part of a regular health exam.  If you are 72 or older, have a CBE every year. Also consider having a breast X-ray (mammogram) every  year.  If you have a family history of breast cancer, talk to your health care provider about genetic screening.  If you are at high risk for breast cancer, talk to your health care provider about having an MRI and a mammogram every year.  Breast cancer gene (BRCA) assessment is recommended for women who have family members with BRCA-related cancers. BRCA-related cancers include: ? Breast. ? Ovarian. ? Tubal. ? Peritoneal cancers.  Results of the assessment will determine the need for genetic counseling and BRCA1 and BRCA2 testing.  Cervical Cancer Your health care provider may recommend that you be screened regularly for cancer of the pelvic organs (ovaries, uterus, and vagina). This screening involves a pelvic examination, including checking for microscopic changes to the surface of your cervix (Pap test). You may be encouraged to have this screening done every 3 years, beginning at age 28.  For women ages 16-65, health care providers may recommend pelvic exams and Pap testing every 3 years, or they may recommend the Pap and pelvic exam, combined with testing for human papilloma virus (HPV), every 5 years. Some types of HPV increase your risk of cervical cancer. Testing for HPV may also be done on women of any age with unclear Pap test results.  Other health care providers may not recommend any screening for nonpregnant women who are considered low risk for pelvic cancer and who do not have symptoms. Ask your health care provider if a screening pelvic exam is right for you.  If you have had past treatment for cervical cancer or a condition that could lead to cancer, you need Pap tests and screening for cancer for at least 20 years after your treatment. If Pap tests have been discontinued, your risk factors (such as having a new sexual partner) need to be reassessed to determine if screening should resume. Some women have medical problems that increase the chance of getting cervical cancer. In  these cases, your health care provider may recommend more frequent screening and Pap tests.  Colorectal Cancer  This type of cancer can be detected and often prevented.  Routine colorectal cancer screening usually begins at 55 years of age and continues through 55 years of age.  Your health care provider may recommend screening at an earlier age if you have risk factors for colon cancer.  Your health care provider may also recommend using home test kits to check for hidden blood in the stool.  A small camera at the end of a tube can be used to examine your colon directly (sigmoidoscopy or colonoscopy). This is done to check for the earliest forms of colorectal cancer.  Routine screening usually begins at age 17.  Direct examination of the colon should be repeated every 5-10 years through 55 years of age. However, you may need to be screened more often if early forms of precancerous polyps or small growths are found.  Skin Cancer  Check your skin from head to toe regularly.  Tell your health care provider about  any new moles or changes in moles, especially if there is a change in a mole's shape or color.  Also tell your health care provider if you have a mole that is larger than the size of a pencil eraser.  Always use sunscreen. Apply sunscreen liberally and repeatedly throughout the day.  Protect yourself by wearing long sleeves, pants, a wide-brimmed hat, and sunglasses whenever you are outside.  Heart disease, diabetes, and high blood pressure  High blood pressure causes heart disease and increases the risk of stroke. High blood pressure is more likely to develop in: ? People who have blood pressure in the high end of the normal range (130-139/85-89 mm Hg). ? People who are overweight or obese. ? People who are African American.  If you are 49-75 years of age, have your blood pressure checked every 3-5 years. If you are 64 years of age or older, have your blood pressure  checked every year. You should have your blood pressure measured twice-once when you are at a hospital or clinic, and once when you are not at a hospital or clinic. Record the average of the two measurements. To check your blood pressure when you are not at a hospital or clinic, you can use: ? An automated blood pressure machine at a pharmacy. ? A home blood pressure monitor.  If you are between 15 years and 83 years old, ask your health care provider if you should take aspirin to prevent strokes.  Have regular diabetes screenings. This involves taking a blood sample to check your fasting blood sugar level. ? If you are at a normal weight and have a low risk for diabetes, have this test once every three years after 55 years of age. ? If you are overweight and have a high risk for diabetes, consider being tested at a younger age or more often. Preventing infection Hepatitis B  If you have a higher risk for hepatitis B, you should be screened for this virus. You are considered at high risk for hepatitis B if: ? You were born in a country where hepatitis B is common. Ask your health care provider which countries are considered high risk. ? Your parents were born in a high-risk country, and you have not been immunized against hepatitis B (hepatitis B vaccine). ? You have HIV or AIDS. ? You use needles to inject street drugs. ? You live with someone who has hepatitis B. ? You have had sex with someone who has hepatitis B. ? You get hemodialysis treatment. ? You take certain medicines for conditions, including cancer, organ transplantation, and autoimmune conditions.  Hepatitis C  Blood testing is recommended for: ? Everyone born from [birth year] through 1963. ? Anyone with known risk factors for hepatitis C.  Sexually transmitted infections (STIs)  You should be screened for sexually transmitted infections (STIs) including gonorrhea and chlamydia if: ? You are sexually active and are younger than  55 years of age. ? You are older than 55 years of age and your health care provider tells you that you are at risk for this type of infection. ? Your sexual activity has changed since you were last screened and you are at an increased risk for chlamydia or gonorrhea. Ask your health care provider if you are at risk.  If you do not have HIV, but are at risk, it may be recommended that you take a prescription medicine daily to prevent HIV infection. This is called pre-exposure prophylaxis (PrEP). You are considered  at risk if: ? You are sexually active and do not regularly use condoms or know the HIV status of your partner(s). ? You take drugs by injection. ? You are sexually active with a partner who has HIV.  Talk with your health care provider about whether you are at high risk of being infected with HIV. If you choose to begin PrEP, you should first be tested for HIV. You should then be tested every 3 months for as long as you are taking PrEP. Pregnancy  If you are premenopausal and you may become pregnant, ask your health care provider about preconception counseling.  If you may become pregnant, take 400 to 800 micrograms (mcg) of folic acid every day.  If you want to prevent pregnancy, talk to your health care provider about birth control (contraception). Osteoporosis and menopause  Osteoporosis is a disease in which the bones lose minerals and strength with aging. This can result in serious bone fractures. Your risk for osteoporosis can be identified using a bone density scan.  If you are 92 years of age or older, or if you are at risk for osteoporosis and fractures, ask your health care provider if you should be screened.  Ask your health care provider whether you should take a calcium or vitamin D supplement to lower your risk for osteoporosis.  Menopause may have certain physical symptoms and risks.  Hormone replacement therapy may reduce some of these symptoms and risks. Talk to  your health care provider about whether hormone replacement therapy is right for you. Follow these instructions at home:  Schedule regular health, dental, and eye exams.  Stay current with your immunizations.  Do not use any tobacco products including cigarettes, chewing tobacco, or electronic cigarettes.  If you are pregnant, do not drink alcohol.  If you are breastfeeding, limit how much and how often you drink alcohol.  Limit alcohol intake to no more than 1 drink per day for nonpregnant women. One drink equals 12 ounces of beer, 5 ounces of wine, or 1 ounces of hard liquor.  Do not use street drugs.  Do not share needles.  Ask your health care provider for help if you need support or information about quitting drugs.  Tell your health care provider if you often feel depressed.  Tell your health care provider if you have ever been abused or do not feel safe at home. This information is not intended to replace advice given to you by your health care provider. Make sure you discuss any questions you have with your health care provider. Document Released: 09/09/2010 Document Revised: 08/02/2015 Document Reviewed: 11/28/2014 Elsevier Interactive Patient Education  [employer].

## 2017-05-21 ENCOUNTER — Other Ambulatory Visit (INDEPENDENT_AMBULATORY_CARE_PROVIDER_SITE_OTHER): Payer: PRIVATE HEALTH INSURANCE

## 2017-05-21 ENCOUNTER — Encounter: Payer: Self-pay | Admitting: Internal Medicine

## 2017-05-21 ENCOUNTER — Ambulatory Visit (INDEPENDENT_AMBULATORY_CARE_PROVIDER_SITE_OTHER): Payer: PRIVATE HEALTH INSURANCE | Admitting: Internal Medicine

## 2017-05-21 VITALS — BP 114/78 | HR 61 | Temp 97.8°F | Resp 16 | Wt 157.0 lb

## 2017-05-21 DIAGNOSIS — Z0000 Encounter for general adult medical examination without abnormal findings: Secondary | ICD-10-CM

## 2017-05-21 DIAGNOSIS — F329 Major depressive disorder, single episode, unspecified: Secondary | ICD-10-CM

## 2017-05-21 DIAGNOSIS — Z23 Encounter for immunization: Secondary | ICD-10-CM | POA: Diagnosis not present

## 2017-05-21 DIAGNOSIS — E039 Hypothyroidism, unspecified: Secondary | ICD-10-CM

## 2017-05-21 DIAGNOSIS — E7849 Other hyperlipidemia: Secondary | ICD-10-CM

## 2017-05-21 DIAGNOSIS — F419 Anxiety disorder, unspecified: Secondary | ICD-10-CM | POA: Diagnosis not present

## 2017-05-21 DIAGNOSIS — F32A Depression, unspecified: Secondary | ICD-10-CM

## 2017-05-21 LAB — COMPREHENSIVE METABOLIC PANEL
ALBUMIN: 4.4 g/dL (ref 3.5–5.2)
ALT: 49 U/L — ABNORMAL HIGH (ref 0–35)
AST: 25 U/L (ref 0–37)
Alkaline Phosphatase: 110 U/L (ref 39–117)
BUN: 16 mg/dL (ref 6–23)
CHLORIDE: 103 meq/L (ref 96–112)
CO2: 32 mEq/L (ref 19–32)
Calcium: 9.6 mg/dL (ref 8.4–10.5)
Creatinine, Ser: 0.9 mg/dL (ref 0.40–1.20)
GFR: 69.2 mL/min (ref >60.00)
GLUCOSE: 93 mg/dL (ref 70–99)
POTASSIUM: 4.6 meq/L (ref 3.5–5.1)
SODIUM: 141 meq/L (ref 135–145)
Total Bilirubin: 0.5 mg/dL (ref 0.2–1.2)
Total Protein: 6.9 g/dL (ref 6.0–8.3)

## 2017-05-21 LAB — CBC WITH DIFFERENTIAL/PLATELET
BASOS PCT: 1 % (ref 0.0–3.0)
Basophils Absolute: 0.1 10*3/uL (ref 0.0–0.1)
EOS PCT: 3.4 % (ref 0.0–5.0)
Eosinophils Absolute: 0.2 10*3/uL (ref 0.0–0.7)
HCT: 40.7 % (ref 36.0–46.0)
HEMOGLOBIN: 14.2 g/dL (ref 12.0–15.0)
Lymphocytes Relative: 27.4 % (ref 12.0–46.0)
Lymphs Abs: 1.6 10*3/uL (ref 0.7–4.0)
MCHC: 35 g/dL (ref 30.0–36.0)
MCV: 91.1 fl (ref 78.0–100.0)
MONO ABS: 0.6 10*3/uL (ref 0.1–1.0)
MONOS PCT: 9.8 % (ref 3.0–12.0)
Neutro Abs: 3.3 10*3/uL (ref 1.4–7.7)
Neutrophils Relative %: 58.4 % (ref 43.0–77.0)
Platelets: 221 10*3/uL (ref 150.0–400.0)
RBC: 4.47 Mil/uL (ref 3.87–5.11)
RDW: 12.8 % (ref 11.5–15.5)
WBC: 5.7 10*3/uL (ref 4.0–10.5)

## 2017-05-21 LAB — LIPID PANEL
Cholesterol: 251 mg/dL — ABNORMAL HIGH (ref 0–200)
HDL: 87.4 mg/dL (ref >39.00)
LDL CALC: 140 mg/dL — AB (ref 0–99)
NONHDL: 163.57
Total CHOL/HDL Ratio: 3
Triglycerides: 119 mg/dL (ref 0.0–149.0)
VLDL: 23.8 mg/dL (ref 0.0–40.0)

## 2017-05-21 LAB — TSH: TSH: 3.25 u[IU]/mL (ref 0.35–4.50)

## 2017-05-21 MED ORDER — LEVOTHYROXINE SODIUM 125 MCG PO TABS: 125.0000 ug | ORAL_TABLET | Freq: Every day | ORAL | 1 refills | Status: DC

## 2017-05-21 MED ORDER — VENLAFAXINE HCL ER 37.5 MG PO CP24: 37.5000 mg | ORAL_CAPSULE | Freq: Every day | ORAL | 1 refills | Status: DC

## 2017-05-21 NOTE — Assessment & Plan Note (Signed)
Controlled Unsure if she still needs medication She may try to taper herself off at some point in the near future-discussed best way to do this Call with any questions or concerns 

## 2017-05-21 NOTE — Assessment & Plan Note (Signed)
Controlled Unsure if she still needs medication She may try to taper herself off at some point in the near future-discussed best way to do this Call with any questions or concerns

## 2017-05-21 NOTE — Assessment & Plan Note (Signed)
Check TSH Titrate medication dose if needed

## 2017-05-21 NOTE — Assessment & Plan Note (Signed)
Check lipid panel, TSH, CMP Eats healthy and exercises, but family history of hyperlipidemia

## 2017-12-23 ENCOUNTER — Other Ambulatory Visit: Payer: Self-pay | Admitting: Internal Medicine

## 2018-06-02 ENCOUNTER — Encounter: Payer: PRIVATE HEALTH INSURANCE | Admitting: Internal Medicine

## 2018-07-09 ENCOUNTER — Telehealth: Payer: Self-pay | Admitting: Internal Medicine

## 2018-07-09 MED ORDER — LEVOTHYROXINE SODIUM 125 MCG PO TABS: 125.0000 ug | ORAL_TABLET | Freq: Every day | ORAL | 0 refills | Status: DC

## 2018-07-09 MED ORDER — VENLAFAXINE HCL ER 37.5 MG PO CP24: 37.5000 mg | ORAL_CAPSULE | Freq: Every day | ORAL | 0 refills | Status: DC

## 2018-07-09 NOTE — Telephone Encounter (Signed)
Copied from CRM [id number]. Topic: Quick Communication - Rx Refill/Question >> Jul 09, 2018  1:11 PM Mickel Baas B, [place] wrote: **Patient calling and states the her annual for March was canceled and now she has relocated and is needing refills. Please advise. Requesting 90 day supply**  Medication: venlafaxine XR (EFFEXOR-XR) 37.5 MG 24 hr capsule levothyroxine (SYNTHROID) 125 MCG tablet  Has the patient contacted their pharmacy? yes (Agent: If no, request that the patient contact the pharmacy for the refill.) (Agent: If yes, when and what did the pharmacy advise?)  Preferred Pharmacy (with phone number or street name): [id number] - [place], [place] - [address]  Agent: Please be advised that RX refills may take up to 3 business days. We ask that you follow-up with your pharmacy.

## 2018-07-09 NOTE — Telephone Encounter (Signed)
Rx sent 

## 2018-08-06 ENCOUNTER — Other Ambulatory Visit: Payer: Self-pay | Admitting: Internal Medicine

## 2018-08-09 NOTE — Progress Notes (Signed)
Virtual Visit via Video Note  I connected with Vanessa Farrell on 08/10/18 at 10:30 AM EDT by a video enabled telemedicine application and verified that I am speaking with the correct person using two identifiers.   I discussed the limitations of evaluation and management by telemedicine and the availability of in person appointments. The patient expressed understanding and agreed to proceed.  The patient is currently at home and I am in the office.    No referring provider.    History of Present Illness: She is here for follow up of her chronic medical conditions.   She is exercising regularly.  Overall she is doing really well.  She has moved to [facility] and is planning on reestablishing with a new PCP once able.  With the coronavirus situation that has not been possible.  She is due for medications.  She feels well and has no concerns.  She feels her medication is all working well and she is happy with her current doses.   Hypothyroidism:  She is taking her medication daily.  She denies any recent changes in energy or weight that are unexplained.   Depression, anxiety: She is taking her medication daily as prescribed. She denies any side effects from the medication. She feels her depression and anxiety are well controlled and she is happy with her current dose of medication.     Review of Systems  Constitutional: Negative for chills, fever and malaise/fatigue.  Respiratory: Negative for cough, shortness of breath and wheezing.   Cardiovascular: Negative for chest pain, palpitations and leg swelling.  Neurological: Negative for headaches.  Psychiatric/Behavioral: Negative for depression. The patient is not nervous/anxious.       Social History   Socioeconomic History  . Marital status: Married    Spouse name: Not on file  . Number of children: Not on file  . Years of education: Not on file  . Highest education level: Not on file  Occupational History  . Not on  file  Social Needs  . Financial resource strain: Not on file  . Food insecurity:    Worry: Not on file    Inability: Not on file  . Transportation needs:    Medical: Not on file    Non-medical: Not on file  Tobacco Use  . Smoking status: Never Smoker  . Smokeless tobacco: Never Used  Substance and Sexual Activity  . Alcohol use: Yes    Alcohol/week: 3.0 standard drinks    Types: 3 Glasses of wine per week  . Drug use: No  . Sexual activity: Not on file  Lifestyle  . Physical activity:    Days per week: Not on file    Minutes per session: Not on file  . Stress: Not on file  Relationships  . Social connections:    Talks on phone: Not on file    Gets together: Not on file    Attends religious service: Not on file    Active member of club or organization: Not on file    Attends meetings of clubs or organizations: Not on file    Relationship status: Not on file  Other Topics Concern  . Not on file  Social History Narrative  . Not on file     Observations/Objective: Appears well in NAD Normal mood and affect  Assessment and Plan:  See Problem List for Assessment and Plan of chronic medical problems.   Follow Up Instructions:    I discussed the assessment and treatment  plan with the patient. The patient was provided an opportunity to ask questions and all were answered. The patient agreed with the plan and demonstrated an understanding of the instructions.   The patient was advised to call back or seek an in-person evaluation if the symptoms worsen or if the condition fails to improve as anticipated.  No follow-up needed-will establish with a new PCP.  6 months of medication sent to pharmacy   Pincus SanesStacy J Burns, MD

## 2018-08-10 ENCOUNTER — Other Ambulatory Visit: Payer: Self-pay

## 2018-08-10 ENCOUNTER — Ambulatory Visit (INDEPENDENT_AMBULATORY_CARE_PROVIDER_SITE_OTHER): Payer: PRIVATE HEALTH INSURANCE | Admitting: Internal Medicine

## 2018-08-10 ENCOUNTER — Encounter: Payer: Self-pay | Admitting: Internal Medicine

## 2018-08-10 DIAGNOSIS — E039 Hypothyroidism, unspecified: Secondary | ICD-10-CM | POA: Diagnosis not present

## 2018-08-10 DIAGNOSIS — F419 Anxiety disorder, unspecified: Secondary | ICD-10-CM

## 2018-08-10 DIAGNOSIS — F3289 Other specified depressive episodes: Secondary | ICD-10-CM | POA: Diagnosis not present

## 2018-08-10 MED ORDER — LEVOTHYROXINE SODIUM 125 MCG PO TABS: 125.0000 ug | ORAL_TABLET | Freq: Every day | ORAL | 5 refills | Status: DC

## 2018-08-10 MED ORDER — VENLAFAXINE HCL ER 37.5 MG PO CP24: 37.5000 mg | ORAL_CAPSULE | Freq: Every day | ORAL | 5 refills | Status: DC

## 2018-08-10 NOTE — Assessment & Plan Note (Signed)
Clinically euthyroid Continue current medication dose She will establish with a new PCP over the next few months we will have blood work done at that time

## 2018-08-10 NOTE — Assessment & Plan Note (Signed)
Anxiety well-controlled Continue venlafaxine 37.5 mg daily

## 2018-08-10 NOTE — Assessment & Plan Note (Signed)
Depression well-controlled Continue venlafaxine 37.5 mg daily

## 2018-09-09 ENCOUNTER — Telehealth: Payer: Self-pay | Admitting: Internal Medicine

## 2018-09-20 MED ORDER — LEVOTHYROXINE SODIUM 125 MCG PO TABS: 125.0000 ug | ORAL_TABLET | Freq: Every day | ORAL | 1 refills | Status: DC

## 2018-09-20 MED ORDER — VENLAFAXINE HCL ER 37.5 MG PO CP24: 37.5000 mg | ORAL_CAPSULE | Freq: Every day | ORAL | 1 refills | Status: DC

## 2018-09-20 NOTE — Addendum Note (Signed)
Addended by: Delice Bison E on: 09/20/2018 11:08 AM   Modules accepted: Orders

## 2018-09-20 NOTE — Telephone Encounter (Signed)
Pt contacted pharmacy for refills on levothyroxine (SYNTHROID) 125 MCG tablet and venlafaxine XR (EFFEXOR-XR) 37.5 MG 24 hr capsule but the pharmacy would not fill stating they did not have rx's for these two medications. Requesting they be resent. Please advise.   [id number] - [place] (Phone) [phone] (Fax)

## 2019-03-21 ENCOUNTER — Other Ambulatory Visit: Payer: Self-pay | Admitting: Internal Medicine

## 2021-06-04 ENCOUNTER — Encounter: Payer: Self-pay | Admitting: Professional

## 2021-06-04 ENCOUNTER — Ambulatory Visit: Payer: BC Managed Care – PPO | Admitting: Professional

## 2021-06-04 DIAGNOSIS — F4323 Adjustment disorder with mixed anxiety and depressed mood: Secondary | ICD-10-CM | POA: Diagnosis not present
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
## 2021-06-04 NOTE — Progress Notes (Signed)
° ° ° ° ° ° ° ° ° ° ° ° ° ° °  Vanessa Farrell, LCMHC °

## 2021-06-15 ENCOUNTER — Ambulatory Visit: Payer: BC Managed Care – PPO | Admitting: Professional

## 2021-06-28 ENCOUNTER — Encounter: Payer: Self-pay | Admitting: Professional

## 2021-06-28 ENCOUNTER — Ambulatory Visit (INDEPENDENT_AMBULATORY_CARE_PROVIDER_SITE_OTHER): Payer: BC Managed Care – PPO | Admitting: Professional

## 2021-06-28 DIAGNOSIS — F4323 Adjustment disorder with mixed anxiety and depressed mood: Secondary | ICD-10-CM

## 2021-06-28 NOTE — Progress Notes (Signed)
° ° ° ° ° ° ° ° ° ° ° ° ° ° °  Jovoni Borkenhagen, LCMHC °

## 2021-06-28 NOTE — Progress Notes (Addendum)
[facility] Counselor/Therapist Progress Note ? ?Patient ID: Vanessa Farrell, MRN: [id number],   ? ?Date: 06/28/2021 ? ?Time Spent: 5-732 ? ?Treatment Type: Individual Therapy ? ?Risk Assessment: ?Danger to Self:  No ?Self-injurious Behavior: No ?Danger to Others: No ? ?Subjective: This session was held via video teletherapy due to the coronavirus risk at this time. The patient consented to video teletherapy and was located at her home during this session. She is aware it is the responsibility of the patient to secure confidentiality on her end of the session. The provider was in a private home office for the duration of this session.  ? ?The patient arrived on time for her webex appointment appearing nicely groomed and easily engaged ? ?Issues addressed: ?1-  treatment planning ?-patient and Clinician developed treatment  ?  -will finalize at next visit ?2-mood ?-feels the best she has felt in years ?-grieving the loss of her son and struggling to accept him as transgender ?  -tearful when discussing ? ?Treatment Plan ?Problems Addressed  ?Anxiety, Grief / Loss Unresolved, Low Self-Esteem, Sleep Disturbance  ?Goals ?1. Begin a healthy grieving process around the loss. ?2. Complete the process of letting go of the lost significant other. ?3. Demonstrate improved self-esteem through more pride in appearance, more assertiveness, greater eye contact, and identification of positive traits in self-talk messages. ?4. Develop a consistent, positive self-image. ?5. Elevate self-esteem. ?6. Enhance ability to effectively cope with the full variety of life's worries and anxieties. ?7. Establish an inward sense of self-worth, confidence, and competence. ?8. Interact socially without undue distress or disability. ?9. Learn and implement coping skills that result in a reduction of anxiety and worry, and improved daily functioning. ?10. Reduce overall frequency, intensity, and duration of the anxiety so that  daily functioning is not impaired. ?Objective ?Verbalize an understanding of the cognitive, physiological, and behavioral components of anxiety and its treatment. ?Target Date: 2022-06-28 Frequency: Biweekly  ?Progress: 0 Modality: individual  ?Related Interventions ?Discuss how generalized anxiety typically involves excessive worry about unrealistic threats, various bodily expressions of tension, overarousal, and hypervigilance, and avoidance of what is threatening that interact to maintain the problem (see Mastery of Your Anxiety and Worry: Therapist Guide by Renard Matter, and Barlow; Treating Generalized Anxiety Disorder by Rygh and Ida Rogue). ?Discuss how treatment targets worry, anxiety symptoms, and avoidance to help the client manage worry effectively, reduce overarousal, and eliminate unnecessary avoidance. ?Objective ?Learn and implement calming skills to reduce overall anxiety and manage anxiety symptoms. ?Target Date: 2022-06-28 Frequency: Biweekly  ?Progress: 0 Modality: individual  ?Related Interventions ?Teach the client calming/relaxation skills (e.g., applied relaxation, progressive muscle relaxation, cue controlled relaxation; mindful breathing; biofeedback) and how to discriminate better between relaxation and tension; teach the client how to apply these skills to his/her daily life (e.g., New Directions in Progressive Muscle Relaxation by Marcelyn Ditty, and Hazlett-Stevens; Treating Generalized Anxiety Disorder by Rygh and Ida Rogue). ?Assign the client homework each session in which he/she practices relaxation exercises daily, gradually applying them progressively from non-anxiety-provoking to anxiety-provoking situations; review and reinforce success while providing corrective feedback toward improvement. ?Objective ?Learn and implement a strategy to limit the association between various environmental settings and worry, delaying the worry until a designated "worry time." ?Target Date:  2022-06-28 Frequency: Biweekly  ?Progress: 0 Modality: individual  ?Related Interventions ?Explain the rationale for using a worry time as well as how it is to be used; agree upon and implement a worry time with the client. ?Teach the client  how to recognize, stop, and postpone worry to the agreed upon worry time using skills such as thought stopping, relaxation, and redirecting attention (or assign "Making Use of the Thought-Stopping Technique" and/or "Worry Time" in the Adult Psychotherapy Homework Planner by Jongsma to assist skill development); encourage use in daily life; review and reinforce success while providing corrective feedback toward improvement. ?Objective ?Verbalize an understanding of the role that cognitive biases play in excessive irrational worry and persistent anxiety symptoms. ?Target Date: 2022-06-28 Frequency: Biweekly  ?Progress: 0 Modality: individual  ?Related Interventions ?Discuss examples demonstrating that unrealistic worry typically overestimates the probability of threats and underestimates or overlooks the client's ability to manage realistic demands (or assign "Past Successful Anxiety Coping" in the Adult Psychotherapy Homework Planner by [facility]). ?Assist the client in analyzing his/her worries by examining potential biases such as the probability of the negative expectation occurring, the real consequences of it occurring, his/her ability to control the outcome, the worst possible outcome, and his/her ability to accept it (see "Analyze the Probability of a Feared Event" in the Adult Psychotherapy Homework Planner by Stephannie Li; Cognitive Therapy of Anxiety Disorders by Laurence Slate). ?Objective ?Learn and implement problem-solving strategies for realistically addressing worries. ?Target Date: 2022-06-28 Frequency: Biweekly  ?Progress: 0 Modality: individual  ?Related Interventions ?Teach the client problem-solving strategies involving specifically defining a problem, generating  options for addressing it, evaluating the pros and cons of each option, selecting and implementing an optional action, and reevaluating and refining the action (or assign "Applying Problem-Solving to Interpersonal Conflict" in the Adult Psychotherapy Homework Planner by Stephannie Li). ?Assign the client a homework exercise in which he/she problem-solves a current problem (see Mastery of Your Anxiety and Worry: Workbook by Elenora Fender and Filbert Schilder or Generalized Anxiety Disorder by Elesa Hacker, and Filbert Schilder); review, reinforce success, and provide corrective feedback toward improvement. ?Objective ?Learn and implement relapse prevention strategies for managing possible future anxiety symptoms. ?Target Date: 2022-06-28 Frequency: Biweekly  ?Progress: 0 Modality: individual  ?Related Interventions ?Discuss with the client the distinction between a lapse and relapse, associating a lapse with an initial and reversible return of worry, anxiety symptoms, or urges to avoid, and relapse with the decision to continue the fearful and avoidant patterns. ?Identify and rehearse with the client the management of future situations or circumstances in which lapses could occur. ?Instruct the client to routinely use new therapeutic skills (e.g., relaxation, cognitive restructuring, exposure, and problem-solving) in daily life to address emergent worries, anxiety, and avoidant tendencies. ?Develop a "coping card" on which coping strategies and other important information (e.g., "Breathe deeply and relax," "Challenge unrealistic worries," "Use problem-solving") are written for the client's later use. ?Objective ?Describe situations, thoughts, feelings, and actions associated with anxieties and worries, their impact on functioning, and attempts to resolve them. ?Target Date: 2022-06-28 Frequency: Biweekly  ?Progress: 0 Modality: individual  ?Related Interventions ?Ask the client to describe his/her past experiences of anxiety and their impact on  functioning; assess the focus, excessiveness, and uncontrollability of the worry and the type, frequency, intensity, and duration of his/her anxiety symptoms (consider using a structured interview such as Th

## 2021-08-08 ENCOUNTER — Encounter: Payer: Self-pay | Admitting: Professional

## 2021-08-08 ENCOUNTER — Ambulatory Visit (INDEPENDENT_AMBULATORY_CARE_PROVIDER_SITE_OTHER): Payer: BC Managed Care – PPO | Admitting: Professional

## 2021-08-08 DIAGNOSIS — F4323 Adjustment disorder with mixed anxiety and depressed mood: Secondary | ICD-10-CM

## 2021-08-08 NOTE — Progress Notes (Deleted)
[facility] Counselor/Therapist Progress Note  Patient ID: Vanessa Farrell, MRN: [id number],    Date: 08/08/2021  Time Spent: 38 minutes 902- 940am  Treatment Type: Individual Therapy  Subjective: This session was held via video teletherapy. The patient consented to video teletherapy and was located at her home during this session. She is aware it is the responsibility of the patient to secure confidentiality on her end of the session. The provider was in a private home office for the duration of this session.   The patient arrived on time for her webex appointment.  Risk Assessment: Danger to Self:  No Self-injurious Behavior: No Danger to Others: No   Issues addressed: 1- treatment planning -patient and Clinician created patient's treatment plan -patient fully participated and is in agreement with her treatment plan.  Problems Addressed  Anxiety, Family Conflict, Financial Stress, Parenting  Goals 1. Achieve a greater level of family connectedness. 2. Achieve a level of competent, effective parenting. Objective Verbalize an understanding of the impact of their reaction on their child's behavior. Target Date: 2022-08-08 Frequency: Biweekly  Progress: 0 Modality: individual  Related Interventions Assign the parents to implement key parenting practices consistently, including establishing realistic age-appropriate rules for acceptable and unacceptable behavior, prompting of positive behavior in the environment, use of positive reinforcement to encourage behavior (e.g., praise and clearly established rewards), use of calm clear direct instruction, time out, and other loss-of-privilege practices for problem behavior. Use a Parent Management Training approach beginning with teaching the parents how parent and child behavioral interactions can encourage or discourage positive or negative behavior and that changing key elements of those interactions (e.g., prompting and  reinforcing positive behaviors) can be used to promote positive change (e.g., Parenting the Strong-Willed Child by Zollie Beckers and Long). Objective Learn and implement parenting practices that have demonstrated effectiveness. Target Date: 2022-08-08 Frequency: Biweekly  Progress: 0 Modality: individual  Related Interventions Teach the parents how to implement key parenting practices consistently, including establishing realistic age-appropriate rules for acceptable and unacceptable behavior, prompting of positive behavior in the environment, use of positive reinforcement to encourage behavior (e.g., praise), use of clear direct instruction, time out and other loss-of-privilege practices for problem behavior, negotiation, and renegotiation - usually with older children and adolescents (see Defiant Teens: A Clinician's Manual for Assessment and Family Intervention by Isaac Laud, and Zella Ball; Defiant Children: A Clinician's Manual for Parent Training by Laureen Ochs). Assign the parents home exercises in which they implement parenting skills and record results of implementation (or assign "Using Reinforcement Principles in Parenting" in the Adult Psychotherapy Homework Planner by [facility]); review in session, providing corrective feedback toward improved, appropriate, and consistent use of skills. Objective Verbalize a sense of increased skill, effectiveness, and confidence in parenting. Target Date: 2022-08-08 Frequency: Biweekly  Progress: 0 Modality: individual  Related Interventions Support, empower, monitor, and encourage the parents in implementing new strategies for parenting their child; reinforce successes; problem-solve obstacles toward consolidating a coordinated, consistent, and effective parenting style. Objective Older children and adolescents learn and implement skills for managing self and interactions with others. Target Date: 2022-08-08 Frequency: Biweekly  Progress: 0 Modality: individual   Related Interventions Use a Cognitive-Behavioral Therapy approach with older children and adolescents using several techniques such as instruction, modeling, role-playing, feedback, and practice to teach the child how to manage his/her emotional reactions, manage interpersonal interactions, and problem-solving conflicts. Objective Develop skills to talk openly and effectively with the children. Target Date: 2022-08-08 Frequency: Biweekly  Progress: 0 Modality: individual  Related Interventions Use instruction, modeling, and role-play to teach the parents how to communicate effectively with their child including use open-ended questions, active listening, and respectful assertive communication that encourage openness, sharing, and ongoing dialogue. Ask the parents to read material on parent-child communication (e.g., How to Talk So Kids Will Listen and Listen So Kids Will Talk by Trish MageFaber and Mazlish; Parent Effectiveness Training by Roger ShelterGordon); help them implement the new communication style in daily dialogue with their children and to see the positive responses each child had to it. Objective Learn and implement strategies to prevent relapse of disruptive behavior. Target Date: 2022-08-08 Frequency: Biweekly  Progress: 0 Modality: individual  Related Interventions Instruct the parent/child to routinely use strategies learned in therapy (e.g., parent training techniques, problem-solving, anger management), building them into his/her life as much as possible. Develop a "coping card" or other recording on which coping strategies and other important information can be kept (e.g., steps in problem-solving, positive coping statements, reminders that were helpful to the client during therapy). Objective Identify major concerns regarding the child's misbehavior and the associated parenting approaches that have been tried. Target Date: 2022-08-08 Frequency: Biweekly  Progress: 0 Modality: individual  Related  Interventions Using empathy and normalization of the parents' struggles, conduct a clinical interview focused on pinpointing the nature and severity of the child's misbehavior; assess parenting styles used to respond to the child's misbehavior, and what triggers and reinforcements may be contributing to the behavior. 3. Achieve a reasonable level of family connectedness and harmony where members support, help, and are concerned for each other. 4. Achieve an inner strength to control personal impulses, cravings, and desires that directly or indirectly increase debt irresponsibly. 5. Decrease the level of present conflict with parents while beginning to let go of or resolving past conflicts with them. Objective Identify own as well as others' role in the family conflicts. Target Date: 2022-08-08 Frequency: Biweekly  Progress: 0 Modality: individual  Related Interventions Ask the client to read material on resolving family conflict (e.g., Making Peace with Your Parents by Jonathon ResidesBloomfield and Felder); encourage and monitor the selection of concepts to begin using in conflict resolution. Confront the client when he/she is not taking responsibility for his/her role in the family conflict and reinforce the client for owning responsibility for his/her contribution to the conflict. Objective Increase the number of positive family interactions by planning activities. Target Date: 2022-08-08 Frequency: Biweekly  Progress: 0 Modality: individual  Related Interventions Assist the client in developing a list of positive family activities that promote harmony (e.g., bowling, fishing, playing table games, doing work projects). Schedule such activities into the family calendar. 6. Effectively manage challenging problem behavior of the child. 7. Enhance ability to effectively cope with the full variety of life's worries and anxieties. Objective Describe situations, thoughts, feelings, and actions associated with  anxieties and worries, their impact on functioning, and attempts to resolve them. Target Date: 2022-08-08 Frequency: Biweekly  Progress: 0 Modality: individual  Related Interventions Ask the client to describe his/her past experiences of anxiety and their impact on functioning; assess the focus, excessiveness, and uncontrollability of the worry and the type, frequency, intensity, and duration of his/her anxiety symptoms (consider using a structured interview such as The Anxiety Disorders Interview Schedule-Adult Version). Objective Verbalize an understanding of the cognitive, physiological, and behavioral components of anxiety and its treatment. Target Date: 2022-08-08 Frequency: Biweekly  Progress: 0 Modality: individual  Related Interventions Discuss how generalized anxiety typically involves excessive worry about unrealistic threats, various bodily  expressions of tension, overarousal, and hypervigilance, and avoidance of what is threatening that interact to maintain the problem (see Mastery of Your Anxiety and Worry: Therapist Guide by Renard Matter, and Barlow; Treating Generalized Anxiety Disorder by Rygh and Ida Rogue). Discuss how treatment targets worry, anxiety symptoms, and avoidance to help the client manage worry effectively, reduce overarousal, and eliminate unnecessary avoidance. Objective Learn and implement a strategy to limit the association between various environmental settings and worry, delaying the worry until a designated "worry time." Target Date: 2022-08-08 Frequency: Biweekly  Progress: 0 Modality: individual  Related Interventions Explain the rationale for using a worry time as well as how it is to be used; agree upon and implement a worry time with the client. Teach the client how to recognize, stop, and postpone worry to the agreed upon worry time using skills such as thought stopping, relaxation, and redirecting attention (or assign "Making Use of the  Thought-Stopping Technique" and/or "Worry Time" in the Adult Psychotherapy Homework Planner by Jongsma to assist skill development); encourage use in daily life; review and reinforce success while providing corrective feedback toward improvement. Objective Verbalize an understanding of the role that cognitive biases play in excessive irrational worry and persistent anxiety symptoms. Target Date: 2022-08-08 Frequency: Biweekly  Progress: 0 Modality: individual  Related Interventions Discuss examples demonstrating that unrealistic worry typically overestimates the probability of threats and underestimates or overlooks the client's ability to manage realistic demands (or assign "Past Successful Anxiety Coping" in the Adult Psychotherapy Homework Planner by [facility]). Assist the client in analyzing his/her worries by examining potential biases such as the probability of the negative expectation occurring, the real consequences of it occurring, his/her ability to control the outcome, the worst possible outcome, and his/her ability to accept it (see "Analyze the Probability of a Feared Event" in the Adult Psychotherapy Homework Planner by Stephannie Li; Cognitive Therapy of Anxiety Disorders by Laurence Slate). Objective Learn and implement problem-solving strategies for realistically addressing worries. Target Date: 2022-08-08 Frequency: Biweekly  Progress: 0 Modality: individual  Related Interventions Teach the client problem-solving strategies involving specifically defining a problem, generating options for addressing it, evaluating the pros and cons of each option, selecting and implementing an optional action, and reevaluating and refining the action (or assign "Applying Problem-Solving to Interpersonal Conflict" in the Adult Psychotherapy Homework Planner by Stephannie Li). Assign the client a homework exercise in which he/she problem-solves a current problem (see Mastery of Your Anxiety and Worry: Workbook by  Elenora Fender and Filbert Schilder or Generalized Anxiety Disorder by Elesa Hacker, and Filbert Schilder); review, reinforce success, and provide corrective feedback toward improvement. Objective Learn to accept limitations in life and commit to tolerating, rather than avoiding, unpleasant emotions while accomplishing meaningful goals. Target Date: 2022-08-08 Frequency: Biweekly  Progress: 0 Modality: individual  Related Interventions Use techniques from Acceptance and Commitment Therapy to help client accept uncomfortable realities such as lack of complete control, imperfections, and uncertainty and tolerate unpleasant emotions and thoughts in order to accomplish value-consistent goals. Objective Identify the major life conflicts from the past and present that form the basis for present anxiety. Target Date: 2022-08-08 Frequency: Biweekly  Progress: 0 Modality: individual  Related Interventions Assist the client in becoming aware of key unresolved life conflicts and in starting to work toward their resolution. Ask the client to develop and process a list of key past and present life conflicts that continue to cause worry. Reinforce the client's insights into the role of his/her past emotional pain and present anxiety. 8. Learn and  implement coping skills that result in a reduction of anxiety and worry, and improved daily functioning. 9. Reach a level of reduced tension, increased satisfaction, and improved communication with family and/or other authority figures. 10. Reach a realistic view and approach to parenting, given the child's developmental level. 11. Reduce overall frequency, intensity, and duration of the anxiety so that daily functioning is not impaired. 12. Resolve financial crisis with a path to eliminate debt. 13. Resolve the core conflict that is the source of anxiety. 14. Revise spending patterns to not exceed income. Objective Describe the details of the current financial situation. Target Date:  2022-08-08 Frequency: Biweekly  Progress: 0 Modality: individual  Related Interventions Provide the client a supportive, nonjudgmental environment by being empathetic, warm, and sensitive to the fact that the topic may elicit guilt, shame, and embarrassment. Explore the client's current financial situation. Assist the client in compiling a complete list of financial obligations. Objective Isolate the sources and causes of the excessive indebtedness. Target Date: 2022-08-08 Frequency: Biweekly  Progress: 0 Modality: individual  Related Interventions Assist in identifying, without projection of blame or holding to excuses, the causes for the financial crisis through a review of the client's history of spending. Objective Identify personal traits that make undisciplined spending possible. Target Date: 2022-08-08 Frequency: Biweekly  Progress: 0 Modality: individual  Related Interventions Probe the client for evidence of low self-esteem, need to impress others, loneliness, or depression that may accelerate unnecessary, unwarranted spending. Objective Use cognitive and behavioral strategies to control the impulse to make unnecessary and unaffordable purchases. Target Date: 2022-08-08 Frequency: Biweekly  Progress: 0 Modality: individual  Related Interventions Role-play situations in which the client must resist the inner temptation to spend beyond reasonable limits, emphasizing positive self-talk that compliments self for being disciplined. Role-play situations in which the client must resist external pressure to spend beyond what he/she can afford (e.g., friend's invitation to golf or go shopping, child's request for a toy), emphasizing being graciously assertive in refusing the request. Teach the client the cognitive strategy of asking self before each purchase: Is this purchase absolutely necessary? Can we afford this? Do we have the cash to pay for this without incurring any further  debt? Urge the client to avoid all impulse buying by delaying every purchase until after 24 hours of thought and by buying only from a prewritten list of items to buy (consider assigning "Impulsive Behavior Journal" from the Adult Psychotherapy Homework Planner by Stephannie Li). Objective Keep weekly and monthly records of financial income and expenses. Target Date: 2022-08-08 Frequency: Biweekly  Progress: 0 Modality: individual  Related Interventions Encourage the client to keep a weekly and monthly record of income and outflow; review his/her records weekly, and reinforce his/her responsible financial decision-making. Offer praise and ongoing encouragement of the client's progress toward debt resolution; recommend the client read The Total Money Makeover: A Proven Plan for Financial Fitness by Reed Pandy). Objective Write a budget that balances income with expenses. Target Date: 2022-08-08 Frequency: Biweekly  Progress: 0 Modality: individual  Related Interventions Review the client's budget as to reasonableness and completeness.  Diagnosis:Adjustment disorder with mixed anxiety and depressed mood  Plan:  -meet again on Wednesday, August 21, 2021 at [facility], [facility]

## 2021-08-08 NOTE — Progress Notes (Signed)
° ° ° ° ° ° ° ° ° ° ° ° ° ° °  Kalah Pflum, LCMHC °

## 2021-08-13 NOTE — Progress Notes (Signed)
A user error has taken place: {error:[id number]}.

## 2021-08-19 ENCOUNTER — Ambulatory Visit: Payer: BC Managed Care – PPO | Admitting: Professional

## 2021-08-22 ENCOUNTER — Encounter: Payer: Self-pay | Admitting: Professional

## 2021-08-22 ENCOUNTER — Ambulatory Visit (INDEPENDENT_AMBULATORY_CARE_PROVIDER_SITE_OTHER): Payer: BC Managed Care – PPO | Admitting: Professional

## 2021-08-22 DIAGNOSIS — F4323 Adjustment disorder with mixed anxiety and depressed mood: Secondary | ICD-10-CM

## 2021-08-22 NOTE — Progress Notes (Signed)
[facility] Counselor/Therapist Progress Note  Patient ID: Vanessa Farrell, MRN: [id number],    Date: 08/22/2021  Time Spent: 53 minutes [id number]  Treatment Type: Individual Therapy  Risk Assessment: Danger to Self:  No Self-injurious Behavior: No Danger to Others: No  Subjective: This session was held via video teletherapy. The patient consented to video teletherapy and was located at her home during this session. She is aware it is the responsibility of the patient to secure confidentiality on her end of the session. The provider was in a private home office for the duration of this session.   The patient arrived on time for her webex appointment.  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 -pt is taking on work of her [employer]/film/video because she wants him to be successful -consider time study between her duties and the ones that her coworker did before she left   -pt realizes she is doing more of her former coworker's duties than her own   -pt to talk with leadership about whether to continue with the tasks of her other coworker 2-mood -feels overwhelmed -as she's aged it's gotten harder to keep it together -since Covid she cannot get her brain to work -pt began crying and was unsure why   -after discussing she realized she's upset because she cannot figure it out himself -pt admits that she has been more irritated -pt not eating healthy   -pt plans to grocery shop  Treatment Plan Problems Addressed  Anxiety, Grief / Loss Unresolved, Low Self-Esteem, Sleep Disturbance   Goals 1. Begin a healthy grieving process around the loss. 2. Complete the process of letting go of the lost significant other. 3. Demonstrate improved self-esteem through more pride in appearance, more assertiveness, greater eye contact, and identification of positive traits in self-talk messages. 4. Develop a consistent, positive self-image. 5. Elevate self-esteem. 6. Enhance ability to effectively cope with the full variety of life's worries and anxieties. 7. Establish an inward sense of self-worth, confidence, and competence. 8. Interact socially without undue distress or disability. 9. Learn and implement coping skills that result in a reduction of anxiety and worry, and improved daily functioning. 10. Reduce overall frequency, intensity, and duration of the anxiety so that daily functioning is not impaired. Objective Verbalize an understanding of the cognitive, physiological, and behavioral components of anxiety and its treatment. Target Date: 2022-06-28 Frequency: Biweekly  Progress: 0 Modality: individual  Related Interventions Discuss how generalized anxiety typically involves excessive worry about unrealistic threats, various bodily expressions of tension, overarousal, and hypervigilance, and avoidance of what is threatening that interact to maintain the problem (see Mastery of Your Anxiety and Worry: Therapist Guide by Renard Matter, and Barlow; Treating Generalized Anxiety Disorder by Rygh and Ida Rogue). Discuss how treatment targets worry, anxiety symptoms, and avoidance to help the client manage worry effectively, reduce overarousal, and eliminate unnecessary avoidance. Objective Learn and implement calming skills to reduce overall anxiety and manage anxiety symptoms. Target Date: 2022-06-28 Frequency: Biweekly  Progress: 0 Modality: individual  Related Interventions Teach the client calming/relaxation skills (e.g., applied relaxation, progressive muscle relaxation, cue  controlled relaxation; mindful breathing; biofeedback) and how to discriminate better between relaxation and tension; teach the client how to apply these skills to his/her daily life (e.g., New Directions in Progressive Muscle Relaxation by Marcelyn Ditty, and Hazlett-Stevens; Treating Generalized Anxiety Disorder by Rygh and Ida Rogue). Assign the client homework each session in which he/she practices relaxation exercises daily, gradually applying them progressively from non-anxiety-provoking to anxiety-provoking situations; review and reinforce success while providing corrective feedback toward improvement. Objective Learn and implement a strategy to limit the association between various environmental settings and worry, delaying the worry until a designated "worry time." Target Date: 2022-06-28 Frequency: Biweekly  Progress: 0 Modality: individual  Related Interventions Explain the rationale for using a worry time as well as how it is to be used; agree upon and implement a worry time with the client. Teach the client how to recognize, stop, and postpone worry to the agreed upon worry time using skills such as thought stopping, relaxation, and redirecting attention (or assign "Making Use of the Thought-Stopping Technique" and/or "Worry Time" in the Adult Psychotherapy Homework Planner by Jongsma to assist skill development); encourage use in daily life; review and reinforce success while providing corrective feedback toward improvement. Objective Verbalize an understanding of the role that cognitive biases play in excessive irrational worry and persistent anxiety symptoms. Target Date: 2022-06-28 Frequency: Biweekly  Progress: 0 Modality: individual  Related Interventions Discuss examples demonstrating that unrealistic worry typically overestimates the probability of threats and underestimates or overlooks the client's ability to manage realistic demands (or assign "Past Successful Anxiety  Coping" in the Adult Psychotherapy Homework Planner by [facility]). Assist the client in analyzing his/her worries by examining potential biases such as the probability of the negative expectation occurring, the real consequences of it occurring, his/her ability to control the outcome, the worst possible outcome, and his/her ability to accept it (see "Analyze the Probability of a Feared Event" in the Adult Psychotherapy Homework Planner by Stephannie Li; Cognitive Therapy of Anxiety Disorders by Laurence Slate). Objective Learn and implement problem-solving strategies for realistically addressing worries. Target Date: 2022-06-28 Frequency: Biweekly  Progress: 0 Modality: individual  Related Interventions Teach the client problem-solving strategies involving specifically defining a problem, generating options for addressing it, evaluating the pros and cons of each option, selecting and implementing an optional action, and reevaluating and refining the action (or assign "Applying Problem-Solving to Interpersonal Conflict" in the Adult Psychotherapy Homework Planner by Stephannie Li). Assign the client a homework exercise in which he/she problem-solves a current problem (see Mastery of Your Anxiety and Worry: Workbook by Elenora Fender and Filbert Schilder or Generalized Anxiety Disorder by Elesa Hacker, and Filbert Schilder); review, reinforce success, and provide corrective feedback toward improvement. Objective Learn and implement relapse prevention strategies for managing possible future anxiety symptoms. Target Date: 2022-06-28 Frequency: Biweekly  Progress: 0 Modality: individual  Related Interventions Discuss with the client the distinction between a lapse and relapse, associating a lapse with an initial and reversible return of worry, anxiety symptoms, or urges to avoid, and relapse with the decision to continue the fearful and avoidant patterns. Identify and rehearse with the client the management of future situations or circumstances  in which lapses could occur. Instruct the client to routinely use new therapeutic skills (e.g., relaxation, cognitive restructuring, exposure, and problem-solving) in daily life to address emergent worries, anxiety, and avoidant tendencies. Develop a "coping card" on which coping strategies and other important information (e.g., "Breathe deeply and relax," "Challenge unrealistic worries," "Use problem-solving") are written for the client's later use.  Objective Describe situations, thoughts, feelings, and actions associated with anxieties and worries, their impact on functioning, and attempts to resolve them. Target Date: 2022-06-28 Frequency: Biweekly  Progress: 0 Modality: individual  Related Interventions Ask the client to describe his/her past experiences of anxiety and their impact on functioning; assess the focus, excessiveness, and uncontrollability of the worry and the type, frequency, intensity, and duration of his/her anxiety symptoms (consider using a structured interview such as The Anxiety Disorders Interview Schedule-Adult Version). 11. Resolve the core conflict that is the source of anxiety. 12. Restore restful sleep pattern. 13. Stabilize anxiety level while increasing ability to function on a daily basis.  Diagnosis:Adjustment disorder with mixed anxiety and depressed mood  Plan:  -re-calibrate my eating -take more notice of when I am taking on someone else's tasks -going to try and take a few minutes for lunch everyday -try to manage my time a little better -reassess tasks with supervisor -meet again on Monday, September 30, 2021 at [facility], [facility]

## 2021-09-30 ENCOUNTER — Ambulatory Visit: Payer: BC Managed Care – PPO | Admitting: Professional

## 2021-09-30 ENCOUNTER — Encounter: Payer: Self-pay | Admitting: Professional

## 2021-09-30 DIAGNOSIS — F4323 Adjustment disorder with mixed anxiety and depressed mood: Secondary | ICD-10-CM

## 2021-09-30 NOTE — Progress Notes (Signed)
[facility] Counselor/Therapist Progress Note  Patient ID: Vanessa Farrell, MRN: [id number],    Date: 09/30/2021  Time Spent: 53 minutes [phone]  Treatment Type: Individual Therapy  Risk Assessment: Danger to Self:  No Self-injurious Behavior: No Danger to Others: No  Subjective: This session was held via video teletherapy. The patient consented to video teletherapy and was located at her home during this session. She is aware it is the responsibility of the patient to secure confidentiality on her end of the session. The provider was in a private home office for the duration of this session.   The patient arrived on time for her webex appointment.  Issues addressed: 1- homework-completed -re-calibrate my eating -take more notice of when I am taking on someone else's tasks -going to try and take a few minutes for lunch everyday -try to manage my time a little better -reassess tasks with supervisor 2-self-care -pt recognized that she was not taking care of herself -she address with her manager   -we don't have to fill in the gaps 3-work -halfway through the season is helpful ane makes her feel better -have not replaced the team member but is able to not take things on 4-fear of disappointing people -she thinks that was learned from her childhood owing up under [employer] -she admits having her friends as closer family 5-feels bothered that her sister texts her about the things her kids post on social media   -her daughter [name] said she was angry and doesn't like that her sister is [place]   -pt was disappointed that daughter was asking other people about what she should do related to school     -pt apologized to her daughter and admits that she wants her to start school local -admits that she is aware that God has her children -"I hven't had a cry baby day at work, everybody can tell I'm better" 6-reviewed measurable objectives -pt was able to  identify improvements in each of her objectives  Treatment Plan Problems Addressed  Anxiety, Grief / Loss Unresolved, Low Self-Esteem, Sleep Disturbance  Goals 1. Begin a healthy grieving process around the loss. 2. Complete the process of letting go of the lost significant other. 3. Demonstrate improved self-esteem through more pride in appearance, more assertiveness, greater eye contact, and identification of positive traits in self-talk messages. 4. Develop a consistent, positive self-image. 5. Elevate self-esteem. 6. Enhance ability to effectively cope with the full variety of life's worries and anxieties. 7. Establish an inward sense of self-worth, confidence, and competence. 8. Interact socially without undue distress or disability. 9. Learn and implement coping skills that result in a reduction of anxiety and worry, and improved daily functioning. 10. Reduce overall frequency, intensity, and duration of the anxiety so that daily functioning is not impaired. Objective Verbalize an understanding of the cognitive, physiological, and behavioral components of anxiety and its treatment. Target Date: 2022-06-28 Frequency: Biweekly  Progress: 80 Modality: individual  Related Interventions Discuss how generalized anxiety typically involves excessive worry about unrealistic threats, various bodily expressions of tension, overarousal, and hypervigilance, and avoidance of what is threatening that interact to maintain the problem (see Mastery of Your Anxiety and Worry: Therapist Guide by Renard Matter, and Barlow; Treating Generalized Anxiety Disorder by Rygh and Ida Rogue). Discuss how treatment targets worry, anxiety symptoms, and avoidance to help the client manage worry effectively, reduce overarousal, and eliminate unnecessary avoidance. Objective Learn and implement calming skills to reduce overall anxiety and manage anxiety symptoms. Target  Date: 2022-06-28 Frequency: Biweekly  Progress:  55 Modality: individual  Related Interventions Teach the client calming/relaxation skills (e.g., applied relaxation, progressive muscle relaxation, cue controlled relaxation; mindful breathing; biofeedback) and how to discriminate better between relaxation and tension; teach the client how to apply these skills to his/her daily life (e.g., New Directions in Progressive Muscle Relaxation by Marcelyn Ditty, and Hazlett-Stevens; Treating Generalized Anxiety Disorder by Rygh and Ida Rogue). Assign the client homework each session in which he/she practices relaxation exercises daily, gradually applying them progressively from non-anxiety-provoking to anxiety-provoking situations; review and reinforce success while providing corrective feedback toward improvement. Objective Learn and implement a strategy to limit the association between various environmental settings and worry, delaying the worry until a designated "worry time." Target Date: 2022-06-28 Frequency: Biweekly  Progress: 0 Modality: individual  Related Interventions Explain the rationale for using a worry time as well as how it is to be used; agree upon and implement a worry time with the client. Teach the client how to recognize, stop, and postpone worry to the agreed upon worry time using skills such as thought stopping, relaxation, and redirecting attention (or assign "Making Use of the Thought-Stopping Technique" and/or "Worry Time" in the Adult Psychotherapy Homework Planner by Jongsma to assist skill development); encourage use in daily life; review and reinforce success while providing corrective feedback toward improvement. Objective Verbalize an understanding of the role that cognitive biases play in excessive irrational worry and persistent anxiety symptoms. Target Date: 2022-06-28 Frequency: Biweekly  Progress: 0 Modality: individual  Related Interventions Discuss examples demonstrating that unrealistic worry typically  overestimates the probability of threats and underestimates or overlooks the client's ability to manage realistic demands (or assign "Past Successful Anxiety Coping" in the Adult Psychotherapy Homework Planner by [facility]). Assist the client in analyzing his/her worries by examining potential biases such as the probability of the negative expectation occurring, the real consequences of it occurring, his/her ability to control the outcome, the worst possible outcome, and his/her ability to accept it (see "Analyze the Probability of a Feared Event" in the Adult Psychotherapy Homework Planner by Stephannie Li; Cognitive Therapy of Anxiety Disorders by Laurence Slate). Objective Learn and implement problem-solving strategies for realistically addressing worries. Target Date: 2022-06-28 Frequency: Biweekly  Progress: 0 Modality: individual  Related Interventions Teach the client problem-solving strategies involving specifically defining a problem, generating options for addressing it, evaluating the pros and cons of each option, selecting and implementing an optional action, and reevaluating and refining the action (or assign "Applying Problem-Solving to Interpersonal Conflict" in the Adult Psychotherapy Homework Planner by Stephannie Li). Assign the client a homework exercise in which he/she problem-solves a current problem (see Mastery of Your Anxiety and Worry: Workbook by Elenora Fender and Filbert Schilder or Generalized Anxiety Disorder by Elesa Hacker, and Filbert Schilder); review, reinforce success, and provide corrective feedback toward improvement. Objective Learn and implement relapse prevention strategies for managing possible future anxiety symptoms. Target Date: 2022-06-28 Frequency: Biweekly  Progress: 0 Modality: individual  Related Interventions Discuss with the client the distinction between a lapse and relapse, associating a lapse with an initial and reversible return of worry, anxiety symptoms, or urges to avoid, and relapse  with the decision to continue the fearful and avoidant patterns. Identify and rehearse with the client the management of future situations or circumstances in which lapses could occur. Instruct the client to routinely use new therapeutic skills (e.g., relaxation, cognitive restructuring, exposure, and problem-solving) in daily life to address emergent worries, anxiety, and avoidant tendencies. Develop a "coping card"  on which coping strategies and other important information (e.g., "Breathe deeply and relax," "Challenge unrealistic worries," "Use problem-solving") are written for the client's later use. Objective Describe situations, thoughts, feelings, and actions associated with anxieties and worries, their impact on functioning, and attempts to resolve them. Target Date: 2022-06-28 Frequency: Biweekly  Progress: 50 Modality: individual  Related Interventions Ask the client to describe his/her past experiences of anxiety and their impact on functioning; assess the focus, excessiveness, and uncontrollability of the worry and the type, frequency, intensity, and duration of his/her anxiety symptoms (consider using a structured interview such as The Anxiety Disorders Interview Schedule-Adult Version). 11. Resolve the core conflict that is the source of anxiety. 12. Restore restful sleep pattern. 13. Stabilize anxiety level while increasing ability to function on a daily basis.  Diagnosis:Adjustment disorder with mixed anxiety and depressed mood  Plan:  -meet again on Monday, October 14, 2021 [facility]

## 2021-10-01 ENCOUNTER — Ambulatory Visit: Payer: BC Managed Care – PPO | Admitting: Professional

## 2021-10-14 ENCOUNTER — Ambulatory Visit (INDEPENDENT_AMBULATORY_CARE_PROVIDER_SITE_OTHER): Payer: BC Managed Care – PPO | Admitting: Professional

## 2021-10-14 DIAGNOSIS — F4323 Adjustment disorder with mixed anxiety and depressed mood: Secondary | ICD-10-CM | POA: Diagnosis not present

## 2021-10-15 ENCOUNTER — Ambulatory Visit: Payer: BC Managed Care – PPO | Admitting: Professional

## 2021-10-22 ENCOUNTER — Encounter: Payer: Self-pay | Admitting: Professional

## 2021-10-22 NOTE — Progress Notes (Addendum)
[place] Counselor/Therapist Progress Note  Patient ID: Vanessa Farrell, MRN: [id number],    Date: 10/14/2021  Time Spent: 49 minutes 5-549pm  Treatment Type: Individual Therapy  Risk Assessment: Danger to Self:  No Self-injurious Behavior: No Danger to Others: No  Subjective:  This session was held via audio teletherapy due to the coronavirus risk at this time. The patient consented to audioteletherapy and was located at her home during this session. She is aware it is the responsibility of the patient to secure confidentiality on her end of the session. The provider was in a private home office for the duration of this session.   The patient arrived on time for her telephone appointment.  Issues addressed: 1- professional -she feels good about the parameters put in place to achieve a work-life balance -she has hired an Environmental consultant who is learning her (shared) responsibilities so that on weekends the tasks still get completed -pt feels good about the new employee and encouraged with her learning curve -she is thankful that the most difficult part of the summer season is behind 2-medication -has been helpful 3-concerns for daughter -pt concerned for possible eating disorder -pt has noticed empty cookie dough containers in the trash in the morning but no cookies -daughter is not happy and appears to be struggling with her next steps -pt thinks daughter is trying to live up to her parent's expectations   -go to school and graduate to get a good job   -pt does not ascribe to that philosophy as much as her spouse -spouse somewhat inflexible about academics -discussed how to learn more about potential for eating disorder vs emotional eating -daughter has had some significant adjustments in the past six months since returning for the cult-like environment she had been involved -seek professional eating disorder/nutritionist advise about whether it is a concern and if  so how to approach daughter  4-family time -pt and her spouse got tickets for a [employer] concert and invited both children who attended -pt reports it was a relaxing time and went well -does have some concerns for son's MH but is allowing him to set the pace at this time  Treatment Plan Problems Addressed  Anxiety, Grief / Loss Unresolved, Low Self-Esteem, Sleep Disturbance  Goals 1. Begin a healthy grieving process around the loss. 2. Complete the process of letting go of the lost significant other. 3. Demonstrate improved self-esteem through more pride in appearance, more assertiveness, greater eye contact, and identification of positive traits in self-talk messages. 4. Develop a consistent, positive self-image. 5. Elevate self-esteem. 6. Enhance ability to effectively cope with the full variety of life's worries and anxieties. 7. Establish an inward sense of self-worth, confidence, and competence. 8. Interact socially without undue distress or disability. 9. Learn and implement coping skills that result in a reduction of anxiety and worry, and improved daily functioning. 10. Reduce overall frequency, intensity, and duration of the anxiety so that daily functioning is not impaired. Objective Verbalize an understanding of the cognitive, physiological, and behavioral components of anxiety and its treatment. Target Date: 2022-06-28 Frequency: Biweekly  Progress: 80 Modality: individual  Related Interventions Discuss how generalized anxiety typically involves excessive worry about unrealistic threats, various bodily expressions of tension, overarousal, and hypervigilance, and avoidance of what is threatening that interact to maintain the problem (see Mastery of Your Anxiety and Worry: Therapist Guide by Corky Mull, and Barlow; Treating Generalized Anxiety Disorder by Rygh and Amparo Bristol). Discuss how treatment targets worry, anxiety symptoms,  and avoidance to help the client manage  worry effectively, reduce overarousal, and eliminate unnecessary avoidance. Objective Learn and implement calming skills to reduce overall anxiety and manage anxiety symptoms. Target Date: 2022-06-28 Frequency: Biweekly  Progress: 55 Modality: individual  Related Interventions Teach the client calming/relaxation skills (e.g., applied relaxation, progressive muscle relaxation, cue controlled relaxation; mindful breathing; biofeedback) and how to discriminate better between relaxation and tension; teach the client how to apply these skills to his/her daily life (e.g., New Directions in Progressive Muscle Relaxation by Marcelyn Ditty, and Hazlett-Stevens; Treating Generalized Anxiety Disorder by Rygh and Ida Rogue). Assign the client homework each session in which he/she practices relaxation exercises daily, gradually applying them progressively from non-anxiety-provoking to anxiety-provoking situations; review and reinforce success while providing corrective feedback toward improvement. Objective Learn and implement a strategy to limit the association between various environmental settings and worry, delaying the worry until a designated "worry time." Target Date: 2022-06-28 Frequency: Biweekly  Progress: 0 Modality: individual  Related Interventions Explain the rationale for using a worry time as well as how it is to be used; agree upon and implement a worry time with the client. Teach the client how to recognize, stop, and postpone worry to the agreed upon worry time using skills such as thought stopping, relaxation, and redirecting attention (or assign "Making Use of the Thought-Stopping Technique" and/or "Worry Time" in the Adult Psychotherapy Homework Planner by Jongsma to assist skill development); encourage use in daily life; review and reinforce success while providing corrective feedback toward improvement. Objective Verbalize an understanding of the role that cognitive biases play in  excessive irrational worry and persistent anxiety symptoms. Target Date: 2022-06-28 Frequency: Biweekly  Progress: 0 Modality: individual  Related Interventions Discuss examples demonstrating that unrealistic worry typically overestimates the probability of threats and underestimates or overlooks the client's ability to manage realistic demands (or assign "Past Successful Anxiety Coping" in the Adult Psychotherapy Homework Planner by [facility]). Assist the client in analyzing his/her worries by examining potential biases such as the probability of the negative expectation occurring, the real consequences of it occurring, his/her ability to control the outcome, the worst possible outcome, and his/her ability to accept it (see "Analyze the Probability of a Feared Event" in the Adult Psychotherapy Homework Planner by Stephannie Li; Cognitive Therapy of Anxiety Disorders by Laurence Slate). Objective Learn and implement problem-solving strategies for realistically addressing worries. Target Date: 2022-06-28 Frequency: Biweekly  Progress: 0 Modality: individual  Related Interventions Teach the client problem-solving strategies involving specifically defining a problem, generating options for addressing it, evaluating the pros and cons of each option, selecting and implementing an optional action, and reevaluating and refining the action (or assign "Applying Problem-Solving to Interpersonal Conflict" in the Adult Psychotherapy Homework Planner by Stephannie Li). Assign the client a homework exercise in which he/she problem-solves a current problem (see Mastery of Your Anxiety and Worry: Workbook by Elenora Fender and Filbert Schilder or Generalized Anxiety Disorder by Elesa Hacker, and Filbert Schilder); review, reinforce success, and provide corrective feedback toward improvement. Objective Learn and implement relapse prevention strategies for managing possible future anxiety symptoms. Target Date: 2022-06-28 Frequency: Biweekly  Progress: 0  Modality: individual  Related Interventions Discuss with the client the distinction between a lapse and relapse, associating a lapse with an initial and reversible return of worry, anxiety symptoms, or urges to avoid, and relapse with the decision to continue the fearful and avoidant patterns. Identify and rehearse with the client the management of future situations or circumstances in which lapses could occur. Instruct  the client to routinely use new therapeutic skills (e.g., relaxation, cognitive restructuring, exposure, and problem-solving) in daily life to address emergent worries, anxiety, and avoidant tendencies. Develop a "coping card" on which coping strategies and other important information (e.g., "Breathe deeply and relax," "Challenge unrealistic worries," "Use problem-solving") are written for the client's later use. Objective Describe situations, thoughts, feelings, and actions associated with anxieties and worries, their impact on functioning, and attempts to resolve them. Target Date: 2022-06-28 Frequency: Biweekly  Progress: 50 Modality: individual  Related Interventions Ask the client to describe his/her past experiences of anxiety and their impact on functioning; assess the focus, excessiveness, and uncontrollability of the worry and the type, frequency, intensity, and duration of his/her anxiety symptoms (consider using a structured interview such as The Anxiety Disorders Interview Schedule-Adult Version). 11. Resolve the core conflict that is the source of anxiety. 12. Restore restful sleep pattern. 13. Stabilize anxiety level while increasing ability to function on a daily basis.  Diagnosis:Adjustment disorder with mixed anxiety and depressed mood  Plan:  -meet again on Monday, October 28, 2021 at [facility], [facility]

## 2021-10-28 ENCOUNTER — Encounter: Payer: Self-pay | Admitting: Professional

## 2021-10-28 ENCOUNTER — Ambulatory Visit (INDEPENDENT_AMBULATORY_CARE_PROVIDER_SITE_OTHER): Payer: BC Managed Care – PPO | Admitting: Professional

## 2021-10-28 DIAGNOSIS — F4323 Adjustment disorder with mixed anxiety and depressed mood: Secondary | ICD-10-CM

## 2021-10-28 NOTE — Progress Notes (Signed)
[facility] Counselor/Therapist Progress Note  Patient ID: Vanessa Farrell, MRN: [id number],    Date: 10/28/2021  Time Spent: 32 minutes [phone]  Treatment Type: Individual Therapy  Risk Assessment: Danger to Self:  No Self-injurious Behavior: No Danger to Others: No  Subjective:  This session was held via video teletherapy. The patient consented to video teletherapy and was located at her home during this session. She is aware it is the responsibility of the patient to secure confidentiality on her end of the session. The provider was in a private home office for the duration of this session.   The patient arrived on time for her webex appointment.  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 extrovert and need for her to plan her alone time  Treatment Plan Problems Addressed  Anxiety, Grief / Loss Unresolved, Low Self-Esteem, Sleep Disturbance  Goals 1. Begin a healthy grieving process around the loss. 2. Complete the process of letting go of the lost significant other. 3. Demonstrate improved self-esteem through more pride in appearance, more assertiveness, greater eye contact, and identification of positive traits in self-talk messages. 4. Develop a consistent, positive self-image. 5. Elevate self-esteem. 6. Enhance ability to effectively cope with the full  variety of life's worries and anxieties. 7. Establish an inward sense of self-worth, confidence, and competence. 8. Interact socially without undue distress or disability. 9. Learn and implement coping skills that result in a reduction of anxiety and worry, and improved daily functioning. 10. Reduce overall frequency, intensity, and duration of the anxiety so that daily functioning is not impaired. Objective Verbalize an understanding of the cognitive, physiological, and behavioral components of anxiety and its treatment. Target Date: 2022-06-28 Frequency: Biweekly  Progress: 80 Modality: individual  Related Interventions Discuss how generalized anxiety typically involves excessive worry about unrealistic threats, various bodily expressions of tension, overarousal, and hypervigilance, and avoidance of what is threatening that interact to maintain the problem (see Mastery of Your Anxiety and Worry: Therapist Guide by Renard Matter, and Barlow; Treating Generalized Anxiety Disorder by Rygh and Ida Rogue). Discuss how treatment targets worry, anxiety symptoms, and avoidance to help the client manage worry effectively, reduce overarousal, and eliminate unnecessary avoidance. Objective Learn and implement calming skills to reduce overall anxiety and manage anxiety symptoms. Target Date: 2022-06-28 Frequency: Biweekly  Progress: 55 Modality: individual  Related Interventions Teach the client calming/relaxation skills (e.g., applied relaxation, progressive muscle relaxation, cue controlled relaxation; mindful breathing; biofeedback) and how to discriminate better between relaxation and tension; teach the client how to apply these skills to his/her daily life (e.g., New Directions in Progressive Muscle Relaxation by Marcelyn Ditty, and Hazlett-Stevens; Treating Generalized Anxiety Disorder by Rygh and Ida Rogue). Assign the client homework each session in which he/she practices relaxation exercises  daily, gradually applying them progressively from non-anxiety-provoking to anxiety-provoking situations; review and reinforce success while providing corrective feedback toward improvement. Objective Learn and implement a strategy to  limit the association between various environmental settings and worry, delaying the worry until a designated "worry time." Target Date: 2022-06-28 Frequency: Biweekly  Progress: 0 Modality: individual  Related Interventions Explain the rationale for using a worry time as well as how it is to be used; agree upon and implement a worry time with the client. Teach the client how to recognize, stop, and postpone worry to the agreed upon worry time using skills such as thought stopping, relaxation, and redirecting attention (or assign "Making Use of the Thought-Stopping Technique" and/or "Worry Time" in the Adult Psychotherapy Homework Planner by Jongsma to assist skill development); encourage use in daily life; review and reinforce success while providing corrective feedback toward improvement. Objective Verbalize an understanding of the role that cognitive biases play in excessive irrational worry and persistent anxiety symptoms. Target Date: 2022-06-28 Frequency: Biweekly  Progress: 0 Modality: individual  Related Interventions Discuss examples demonstrating that unrealistic worry typically overestimates the probability of threats and underestimates or overlooks the client's ability to manage realistic demands (or assign "Past Successful Anxiety Coping" in the Adult Psychotherapy Homework Planner by [facility]). Assist the client in analyzing his/her worries by examining potential biases such as the probability of the negative expectation occurring, the real consequences of it occurring, his/her ability to control the outcome, the worst possible outcome, and his/her ability to accept it (see "Analyze the Probability of a Feared Event" in the Adult Psychotherapy Homework Planner  by Stephannie Li; Cognitive Therapy of Anxiety Disorders by Laurence Slate). Objective Learn and implement problem-solving strategies for realistically addressing worries. Target Date: 2022-06-28 Frequency: Biweekly  Progress: 0 Modality: individual  Related Interventions Teach the client problem-solving strategies involving specifically defining a problem, generating options for addressing it, evaluating the pros and cons of each option, selecting and implementing an optional action, and reevaluating and refining the action (or assign "Applying Problem-Solving to Interpersonal Conflict" in the Adult Psychotherapy Homework Planner by Stephannie Li). Assign the client a homework exercise in which he/she problem-solves a current problem (see Mastery of Your Anxiety and Worry: Workbook by Elenora Fender and Filbert Schilder or Generalized Anxiety Disorder by Elesa Hacker, and Filbert Schilder); review, reinforce success, and provide corrective feedback toward improvement. Objective Learn and implement relapse prevention strategies for managing possible future anxiety symptoms. Target Date: 2022-06-28 Frequency: Biweekly  Progress: 0 Modality: individual  Related Interventions Discuss with the client the distinction between a lapse and relapse, associating a lapse with an initial and reversible return of worry, anxiety symptoms, or urges to avoid, and relapse with the decision to continue the fearful and avoidant patterns. Identify and rehearse with the client the management of future situations or circumstances in which lapses could occur. Instruct the client to routinely use new therapeutic skills (e.g., relaxation, cognitive restructuring, exposure, and problem-solving) in daily life to address emergent worries, anxiety, and avoidant tendencies. Develop a "coping card" on which coping strategies and other important information (e.g., "Breathe deeply and relax," "Challenge unrealistic worries," "Use problem-solving") are written for the  client's later use. Objective Describe situations, thoughts, feelings, and actions associated with anxieties and worries, their impact on functioning, and attempts to resolve them. Target Date: 2022-06-28 Frequency: Biweekly  Progress: 50 Modality: individual  Related Interventions Ask the client to describe his/her past experiences of anxiety and their impact on functioning; assess the focus, excessiveness, and uncontrollability of the worry and the type, frequency, intensity, and duration of his/her anxiety symptoms (consider using a structured interview such as The Anxiety Disorders Interview Schedule-Adult Version). 11. Resolve the  core conflict that is the source of anxiety. 12. Restore restful sleep pattern. 13. Stabilize anxiety level while increasing ability to function on a daily basis.  Diagnosis:Adjustment disorder with mixed anxiety and depressed mood  Plan:  -meet again on Tuesday, November 12, 2021 at [facility], [facility]

## 2021-10-29 ENCOUNTER — Ambulatory Visit: Payer: BC Managed Care – PPO | Admitting: Professional

## 2021-11-12 ENCOUNTER — Ambulatory Visit: Payer: BC Managed Care – PPO | Admitting: Professional

## 2021-11-12 ENCOUNTER — Ambulatory Visit (INDEPENDENT_AMBULATORY_CARE_PROVIDER_SITE_OTHER): Payer: BC Managed Care – PPO | Admitting: Professional

## 2021-11-12 ENCOUNTER — Encounter: Payer: Self-pay | Admitting: Professional

## 2021-11-12 DIAGNOSIS — F4323 Adjustment disorder with mixed anxiety and depressed mood: Secondary | ICD-10-CM

## 2021-11-12 NOTE — Progress Notes (Signed)
[facility] Counselor/Therapist Progress Note  Patient ID: Vanessa Farrell, MRN: [id number],    Date: 10/28/2021  Time Spent: 51 minutes [id number]  Treatment Type: Individual Therapy  Risk Assessment: Danger to Self:  No Self-injurious Behavior: No Danger to Others: No  Subjective:  This session was held via video teletherapy. The patient consented to video teletherapy and was located at her home during this session. She is aware it is the responsibility of the patient to secure confidentiality on her end of the session. The provider was in a private home office for the duration of this session.   The patient arrived on time for her webex appointment.  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 3-concerns for daughter -pt did not say anything and her daughter's eating has changed -she has noticed that she is preparing and eating more vegetables -her daughter was away this weekend and she came back in a good place -discussed how her daughter is an emerging adult 4-passive-aggressive -pt said her mother was passive-aggressive -pt thinks she has some of the same communication style -she admits feeling angry   -it's hard to reconcile why her mother did not invest in her children   -her mother never had her children spend the night   -the pt never understood why they never took part in her children's lives 5-pt's relationship with mom -has improved since father's death -will not visit the pt -pt said in some ways she is closer to her mom -she has more frequent phone contact with her then she has ever had -forgiving your mother for what she did "wrong"   -mother has no way to "right her wrongs" 6-transitioning son Coby [name]) -is on his third name change -love him through it  Treatment Plan Problems Addressed  Anxiety, Grief / Loss Unresolved, Low Self-Esteem, Sleep Disturbance  Goals 1. Begin a healthy  grieving process around the loss. 2. Complete the process of letting go of the lost significant other. 3. Demonstrate improved self-esteem through more pride in appearance, more assertiveness, greater eye contact, and identification of positive traits in self-talk messages. 4. Develop a consistent, positive self-image. 5. Elevate self-esteem. 6. Enhance ability to effectively cope with the full variety of life's worries and anxieties. 7. Establish an inward sense of self-worth, confidence, and competence. 8. Interact socially without undue distress or disability. 9. Learn and implement coping skills that result in a reduction of anxiety and worry, and improved daily functioning. 10. Reduce overall frequency, intensity, and duration of the anxiety so that daily functioning is not impaired. Objective Verbalize an understanding of the cognitive, physiological, and behavioral components of anxiety and its treatment. Target Date: 2022-06-28 Frequency: Biweekly  Progress: 80 Modality: individual  Related Interventions Discuss how generalized anxiety typically involves excessive worry about unrealistic threats, various bodily expressions of tension, overarousal, and hypervigilance, and avoidance of what is threatening that interact to maintain the problem (see Mastery of Your Anxiety and Worry: Therapist Guide by Renard Matter, and Barlow; Treating Generalized Anxiety Disorder by Rygh and Ida Rogue). Discuss how treatment targets worry, anxiety symptoms, and avoidance to help the client manage worry effectively, reduce overarousal, and eliminate unnecessary avoidance. Objective Learn and implement calming skills to reduce overall anxiety and manage anxiety symptoms. Target Date: 2022-06-28 Frequency: Biweekly  Progress: 55 Modality: individual  Related Interventions Teach the client calming/relaxation skills (e.g., applied relaxation, progressive muscle relaxation, cue controlled relaxation; mindful  breathing; biofeedback) and how to discriminate better  between relaxation and tension; teach the client how to apply these skills to his/her daily life (e.g., New Directions in Progressive Muscle Relaxation by Marcelyn Ditty, and Hazlett-Stevens; Treating Generalized Anxiety Disorder by Rygh and Ida Rogue). Assign the client homework each session in which he/she practices relaxation exercises daily, gradually applying them progressively from non-anxiety-provoking to anxiety-provoking situations; review and reinforce success while providing corrective feedback toward improvement. Objective Learn and implement a strategy to limit the association between various environmental settings and worry, delaying the worry until a designated "worry time." Target Date: 2022-06-28 Frequency: Biweekly  Progress: 0 Modality: individual  Related Interventions Explain the rationale for using a worry time as well as how it is to be used; agree upon and implement a worry time with the client. Teach the client how to recognize, stop, and postpone worry to the agreed upon worry time using skills such as thought stopping, relaxation, and redirecting attention (or assign "Making Use of the Thought-Stopping Technique" and/or "Worry Time" in the Adult Psychotherapy Homework Planner by Jongsma to assist skill development); encourage use in daily life; review and reinforce success while providing corrective feedback toward improvement. Objective Verbalize an understanding of the role that cognitive biases play in excessive irrational worry and persistent anxiety symptoms. Target Date: 2022-06-28 Frequency: Biweekly  Progress: 0 Modality: individual  Related Interventions Discuss examples demonstrating that unrealistic worry typically overestimates the probability of threats and underestimates or overlooks the client's ability to manage realistic demands (or assign "Past Successful Anxiety Coping" in the Adult Psychotherapy  Homework Planner by [facility]). Assist the client in analyzing his/her worries by examining potential biases such as the probability of the negative expectation occurring, the real consequences of it occurring, his/her ability to control the outcome, the worst possible outcome, and his/her ability to accept it (see "Analyze the Probability of a Feared Event" in the Adult Psychotherapy Homework Planner by Stephannie Li; Cognitive Therapy of Anxiety Disorders by Laurence Slate). Objective Learn and implement problem-solving strategies for realistically addressing worries. Target Date: 2022-06-28 Frequency: Biweekly  Progress: 0 Modality: individual  Related Interventions Teach the client problem-solving strategies involving specifically defining a problem, generating options for addressing it, evaluating the pros and cons of each option, selecting and implementing an optional action, and reevaluating and refining the action (or assign "Applying Problem-Solving to Interpersonal Conflict" in the Adult Psychotherapy Homework Planner by Stephannie Li). Assign the client a homework exercise in which he/she problem-solves a current problem (see Mastery of Your Anxiety and Worry: Workbook by Elenora Fender and Filbert Schilder or Generalized Anxiety Disorder by Elesa Hacker, and Filbert Schilder); review, reinforce success, and provide corrective feedback toward improvement. Objective Learn and implement relapse prevention strategies for managing possible future anxiety symptoms. Target Date: 2022-06-28 Frequency: Biweekly  Progress: 0 Modality: individual  Related Interventions Discuss with the client the distinction between a lapse and relapse, associating a lapse with an initial and reversible return of worry, anxiety symptoms, or urges to avoid, and relapse with the decision to continue the fearful and avoidant patterns. Identify and rehearse with the client the management of future situations or circumstances in which lapses could  occur. Instruct the client to routinely use new therapeutic skills (e.g., relaxation, cognitive restructuring, exposure, and problem-solving) in daily life to address emergent worries, anxiety, and avoidant tendencies. Develop a "coping card" on which coping strategies and other important information (e.g., "Breathe deeply and relax," "Challenge unrealistic worries," "Use problem-solving") are written for the client's later use. Objective Describe situations, thoughts, feelings, and actions associated with anxieties  and worries, their impact on functioning, and attempts to resolve them. Target Date: 2022-06-28 Frequency: Biweekly  Progress: 50 Modality: individual  Related Interventions Ask the client to describe his/her past experiences of anxiety and their impact on functioning; assess the focus, excessiveness, and uncontrollability of the worry and the type, frequency, intensity, and duration of his/her anxiety symptoms (consider using a structured interview such as The Anxiety Disorders Interview Schedule-Adult Version). 11. Resolve the core conflict that is the source of anxiety. 12. Restore restful sleep pattern. 13. Stabilize anxiety level while increasing ability to function on a daily basis.  Diagnosis:No diagnosis found.  Plan:  -meet again on Tuesday, November 12, 2021 at [facility], [facility]  [place], [facility]

## 2021-11-25 ENCOUNTER — Ambulatory Visit: Payer: BC Managed Care – PPO | Admitting: Professional

## 2021-11-26 ENCOUNTER — Ambulatory Visit: Payer: BC Managed Care – PPO | Admitting: Professional

## 2021-12-09 ENCOUNTER — Ambulatory Visit (INDEPENDENT_AMBULATORY_CARE_PROVIDER_SITE_OTHER): Payer: BC Managed Care – PPO | Admitting: Professional

## 2021-12-09 ENCOUNTER — Encounter: Payer: Self-pay | Admitting: Professional

## 2021-12-09 DIAGNOSIS — F4323 Adjustment disorder with mixed anxiety and depressed mood: Secondary | ICD-10-CM

## 2021-12-09 NOTE — Progress Notes (Signed)
[facility] Counselor/Therapist Progress Note  Patient ID: Vanessa Farrell, MRN: [id number],    Date: 10/28/2021  Time Spent: 51 minutes [phone]  Treatment Type: Individual Therapy  Risk Assessment: Danger to Self:  No Self-injurious Behavior: No Danger to Others: No  Subjective:  This session was held via video teletherapy. The patient consented to video teletherapy and was located at her home during this session. She is aware it is the responsibility of the patient to secure confidentiality on her end of the session. The provider was in a private home office for the duration of this session.   The patient arrived on time for her webex appointment.  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 -pt has given him permission to be the person he wants to be -pt's husband mike was very critical   -son Konrad Dolores told his dad he is going to continue c-had a really good visit with him -her son told her he is happy for the first time in several years 2-daughter Judson Roch -doing well -paying cash for her therapy because the therapist is out of network -is going to [place] for seven days to see a school that has offered her a teaching job 3-Andrea -was about to be homeless and knew he could not come home -he called his biological father who is allowing him to stay with him -his father is harder on him in some ways -pt has been assisting him to get his feet on the ground   -carries some guilt about missing the fact that he had issues   -thinks she may ask him to pay her back when he gets his first paycheck -discussed how to assist him with saving so that when he needs money he is using his own  Treatment Plan Problems Addressed  Anxiety, Grief / Loss Unresolved, Low Self-Esteem, Sleep Disturbance  Goals 1. Begin a healthy grieving process around the loss. 2. Complete the process of letting go of the lost  significant other. 3. Demonstrate improved self-esteem through more pride in appearance, more assertiveness, greater eye contact, and identification of positive traits in self-talk messages. 4. Develop a consistent, positive self-image. 5. Elevate self-esteem. 6. Enhance ability to effectively cope with the full variety of life's worries and anxieties. 7. Establish an inward sense of self-worth, confidence, and competence. 8. Interact socially without undue distress or disability. 9. Learn and implement coping skills that result in a reduction of anxiety and worry, and improved daily functioning. 10. Reduce overall frequency, intensity, and duration of the anxiety so that daily functioning is not impaired. Objective Verbalize an understanding of the cognitive, physiological, and behavioral components of anxiety and its treatment. Target Date: 2022-06-28 Frequency: Biweekly  Progress: 80 Modality: individual  Related Interventions Discuss how generalized anxiety typically involves excessive worry about unrealistic threats, various bodily expressions of tension, overarousal, and hypervigilance, and avoidance of what is threatening that interact to maintain the problem (see Mastery of Your Anxiety and Worry: Therapist Guide by Corky Mull, and Barlow; Treating Generalized Anxiety Disorder by Rygh and Amparo Bristol). Discuss how treatment targets worry, anxiety symptoms, and avoidance to help the client manage worry effectively, reduce overarousal, and eliminate unnecessary avoidance. Objective Learn and implement calming skills to reduce overall anxiety and manage anxiety symptoms. Target Date: 2022-06-28 Frequency: Biweekly  Progress: 55 Modality: individual  Related Interventions Teach the client calming/relaxation skills (e.g., applied relaxation, progressive muscle relaxation, cue controlled relaxation; mindful breathing;  biofeedback) and how to discriminate better between relaxation and  tension; teach the client how to apply these skills to his/her daily life (e.g., New Directions in Progressive Muscle Relaxation by Casper Harrison, and Hazlett-Stevens; Treating Generalized Anxiety Disorder by Rygh and Amparo Bristol). Assign the client homework each session in which he/she practices relaxation exercises daily, gradually applying them progressively from non-anxiety-provoking to anxiety-provoking situations; review and reinforce success while providing corrective feedback toward improvement. Objective Learn and implement a strategy to limit the association between various environmental settings and worry, delaying the worry until a designated "worry time." Target Date: 2022-06-28 Frequency: Biweekly  Progress: 0 Modality: individual  Related Interventions Explain the rationale for using a worry time as well as how it is to be used; agree upon and implement a worry time with the client. Teach the client how to recognize, stop, and postpone worry to the agreed upon worry time using skills such as thought stopping, relaxation, and redirecting attention (or assign "Making Use of the Thought-Stopping Technique" and/or "Worry Time" in the Adult Psychotherapy Homework Planner by Jongsma to assist skill development); encourage use in daily life; review and reinforce success while providing corrective feedback toward improvement. Objective Verbalize an understanding of the role that cognitive biases play in excessive irrational worry and persistent anxiety symptoms. Target Date: 2022-06-28 Frequency: Biweekly  Progress: 0 Modality: individual  Related Interventions Discuss examples demonstrating that unrealistic worry typically overestimates the probability of threats and underestimates or overlooks the client's ability to manage realistic demands (or assign "Past Successful Anxiety Coping" in the Adult Psychotherapy Homework Planner by [facility]). Assist the client in analyzing his/her worries  by examining potential biases such as the probability of the negative expectation occurring, the real consequences of it occurring, his/her ability to control the outcome, the worst possible outcome, and his/her ability to accept it (see "Analyze the Probability of a Feared Event" in the Adult Psychotherapy Homework Planner by Bryn Gulling; Cognitive Therapy of Anxiety Disorders by Alison Stalling). Objective Learn and implement problem-solving strategies for realistically addressing worries. Target Date: 2022-06-28 Frequency: Biweekly  Progress: 0 Modality: individual  Related Interventions Teach the client problem-solving strategies involving specifically defining a problem, generating options for addressing it, evaluating the pros and cons of each option, selecting and implementing an optional action, and reevaluating and refining the action (or assign "Applying Problem-Solving to Interpersonal Conflict" in the Adult Psychotherapy Homework Planner by Bryn Gulling). Assign the client a homework exercise in which he/she problem-solves a current problem (see Mastery of Your Anxiety and Worry: Workbook by Adora Fridge and Eliot Ford or Generalized Anxiety Disorder by Eather Colas, and Eliot Ford); review, reinforce success, and provide corrective feedback toward improvement. Objective Learn and implement relapse prevention strategies for managing possible future anxiety symptoms. Target Date: 2022-06-28 Frequency: Biweekly  Progress: 0 Modality: individual  Related Interventions Discuss with the client the distinction between a lapse and relapse, associating a lapse with an initial and reversible return of worry, anxiety symptoms, or urges to avoid, and relapse with the decision to continue the fearful and avoidant patterns. Identify and rehearse with the client the management of future situations or circumstances in which lapses could occur. Instruct the client to routinely use new therapeutic skills (e.g., relaxation,  cognitive restructuring, exposure, and problem-solving) in daily life to address emergent worries, anxiety, and avoidant tendencies. Develop a "coping card" on which coping strategies and other important information (e.g., "Breathe deeply and relax," "Challenge unrealistic worries," "Use problem-solving") are written for the client's later use. Objective Describe situations,  thoughts, feelings, and actions associated with anxieties and worries, their impact on functioning, and attempts to resolve them. Target Date: 2022-06-28 Frequency: Biweekly  Progress: 50 Modality: individual  Related Interventions Ask the client to describe his/her past experiences of anxiety and their impact on functioning; assess the focus, excessiveness, and uncontrollability of the worry and the type, frequency, intensity, and duration of his/her anxiety symptoms (consider using a structured interview such as The Anxiety Disorders Interview Schedule-Adult Version). 11. Resolve the core conflict that is the source of anxiety. 12. Restore restful sleep pattern. 13. Stabilize anxiety level while increasing ability to function on a daily basis.  Diagnosis:Adjustment disorder with mixed anxiety and depressed mood  Plan:  -meet again on Monday, December 23, 2021 at [facility], [facility]

## 2021-12-23 ENCOUNTER — Encounter: Payer: Self-pay | Admitting: Professional

## 2021-12-23 ENCOUNTER — Ambulatory Visit (INDEPENDENT_AMBULATORY_CARE_PROVIDER_SITE_OTHER): Payer: BC Managed Care – PPO | Admitting: Professional

## 2021-12-23 DIAGNOSIS — F4323 Adjustment disorder with mixed anxiety and depressed mood: Secondary | ICD-10-CM

## 2021-12-23 NOTE — Progress Notes (Signed)
[facility] Counselor/Therapist Progress Note  Patient ID: PALIN TRISTAN, MRN: [id number],    Date: 10/28/2021  Time Spent: 62 minutes [id number]  Treatment Type: Individual Therapy  Risk Assessment: Danger to Self:  No Self-injurious Behavior: No Danger to Others: No  Subjective:  This session was held via video teletherapy. The patient consented to video teletherapy and was located at her home during this session. She is aware it is the responsibility of the patient to secure confidentiality on her end of the session. The provider was in a private home office for the duration of this session.   The patient arrived on time for her webex appointment.  Issues addressed: 1-transgender son Coby a-diagnosed with Borderline Personality Disorder (BPD) -Coby did not share any additional information -pt has ordered book to read to learn more about -educated pt on signs/symptoms, and treatment b-reminded pt she can support him as necessary but to continue to set clear boundaries c-pt admitted she does not know if her family will ever experience a get together again   -pt reports that Coby has been so difficult   -doesn't know sibs will reach out to him as requested d-how to obtain support for herself -for transgender and borderline personality issues 2-daughter [name] -still planning on going to [place] -pt's spouse is coping okay   -he said he is proud of her for sticking to her decision  Treatment Plan Problems Addressed  Anxiety, Grief / Loss Unresolved, Low Self-Esteem, Sleep Disturbance  Goals 1. Begin a healthy grieving process around the loss. 2. Complete the process of letting go of the lost significant other. 3. Demonstrate improved self-esteem through more pride in appearance, more assertiveness, greater eye contact, and identification of positive traits in self-talk messages. 4. Develop a consistent, positive self-image. 5. Elevate self-esteem. 6.  Enhance ability to effectively cope with the full variety of life's worries and anxieties. 7. Establish an inward sense of self-worth, confidence, and competence. 8. Interact socially without undue distress or disability. 9. Learn and implement coping skills that result in a reduction of anxiety and worry, and improved daily functioning. 10. Reduce overall frequency, intensity, and duration of the anxiety so that daily functioning is not impaired. Objective Verbalize an understanding of the cognitive, physiological, and behavioral components of anxiety and its treatment. Target Date: 2022-06-28 Frequency: Biweekly  Progress: 80 Modality: individual  Related Interventions Discuss how generalized anxiety typically involves excessive worry about unrealistic threats, various bodily expressions of tension, overarousal, and hypervigilance, and avoidance of what is threatening that interact to maintain the problem (see Mastery of Your Anxiety and Worry: Therapist Guide by Renard Matter, and Barlow; Treating Generalized Anxiety Disorder by Rygh and Ida Rogue). Discuss how treatment targets worry, anxiety symptoms, and avoidance to help the client manage worry effectively, reduce overarousal, and eliminate unnecessary avoidance. Objective Learn and implement calming skills to reduce overall anxiety and manage anxiety symptoms. Target Date: 2022-06-28 Frequency: Biweekly  Progress: 55 Modality: individual  Related Interventions Teach the client calming/relaxation skills (e.g., applied relaxation, progressive muscle relaxation, cue controlled relaxation; mindful breathing; biofeedback) and how to discriminate better between relaxation and tension; teach the client how to apply these skills to his/her daily life (e.g., New Directions in Progressive Muscle Relaxation by Marcelyn Ditty, and Hazlett-Stevens; Treating Generalized Anxiety Disorder by Rygh and Ida Rogue). Assign the client homework each  session in which he/she practices relaxation exercises daily, gradually applying them progressively from non-anxiety-provoking to anxiety-provoking situations; review and reinforce success while providing corrective feedback  toward improvement. Objective Learn and implement a strategy to limit the association between various environmental settings and worry, delaying the worry until a designated "worry time." Target Date: 2022-06-28 Frequency: Biweekly  Progress: 0 Modality: individual  Related Interventions Explain the rationale for using a worry time as well as how it is to be used; agree upon and implement a worry time with the client. Teach the client how to recognize, stop, and postpone worry to the agreed upon worry time using skills such as thought stopping, relaxation, and redirecting attention (or assign "Making Use of the Thought-Stopping Technique" and/or "Worry Time" in the Adult Psychotherapy Homework Planner by Jongsma to assist skill development); encourage use in daily life; review and reinforce success while providing corrective feedback toward improvement. Objective Verbalize an understanding of the role that cognitive biases play in excessive irrational worry and persistent anxiety symptoms. Target Date: 2022-06-28 Frequency: Biweekly  Progress: 0 Modality: individual  Related Interventions Discuss examples demonstrating that unrealistic worry typically overestimates the probability of threats and underestimates or overlooks the client's ability to manage realistic demands (or assign "Past Successful Anxiety Coping" in the Adult Psychotherapy Homework Planner by [facility]). Assist the client in analyzing his/her worries by examining potential biases such as the probability of the negative expectation occurring, the real consequences of it occurring, his/her ability to control the outcome, the worst possible outcome, and his/her ability to accept it (see "Analyze the Probability of a  Feared Event" in the Adult Psychotherapy Homework Planner by Bryn Gulling; Cognitive Therapy of Anxiety Disorders by Alison Stalling). Objective Learn and implement problem-solving strategies for realistically addressing worries. Target Date: 2022-06-28 Frequency: Biweekly  Progress: 0 Modality: individual  Related Interventions Teach the client problem-solving strategies involving specifically defining a problem, generating options for addressing it, evaluating the pros and cons of each option, selecting and implementing an optional action, and reevaluating and refining the action (or assign "Applying Problem-Solving to Interpersonal Conflict" in the Adult Psychotherapy Homework Planner by Bryn Gulling). Assign the client a homework exercise in which he/she problem-solves a current problem (see Mastery of Your Anxiety and Worry: Workbook by Adora Fridge and Eliot Ford or Generalized Anxiety Disorder by Eather Colas, and Eliot Ford); review, reinforce success, and provide corrective feedback toward improvement. Objective Learn and implement relapse prevention strategies for managing possible future anxiety symptoms. Target Date: 2022-06-28 Frequency: Biweekly  Progress: 0 Modality: individual  Related Interventions Discuss with the client the distinction between a lapse and relapse, associating a lapse with an initial and reversible return of worry, anxiety symptoms, or urges to avoid, and relapse with the decision to continue the fearful and avoidant patterns. Identify and rehearse with the client the management of future situations or circumstances in which lapses could occur. Instruct the client to routinely use new therapeutic skills (e.g., relaxation, cognitive restructuring, exposure, and problem-solving) in daily life to address emergent worries, anxiety, and avoidant tendencies. Develop a "coping card" on which coping strategies and other important information (e.g., "Breathe deeply and relax," "Challenge  unrealistic worries," "Use problem-solving") are written for the client's later use. Objective Describe situations, thoughts, feelings, and actions associated with anxieties and worries, their impact on functioning, and attempts to resolve them. Target Date: 2022-06-28 Frequency: Biweekly  Progress: 50 Modality: individual  Related Interventions Ask the client to describe his/her past experiences of anxiety and their impact on functioning; assess the focus, excessiveness, and uncontrollability of the worry and the type, frequency, intensity, and duration of his/her anxiety symptoms (consider using a structured interview such as  The Anxiety Disorders Interview Schedule-Adult Version). 11. Resolve the core conflict that is the source of anxiety. 12. Restore restful sleep pattern. 13. Stabilize anxiety level while increasing ability to function on a daily basis.  Diagnosis:Adjustment disorder with mixed anxiety and depressed mood  Plan:  -come to next session prepared to share what she has learned regarding BPD. -meet again on Monday, January 06, 2022 at [facility], [facility]

## 2022-01-06 ENCOUNTER — Ambulatory Visit (INDEPENDENT_AMBULATORY_CARE_PROVIDER_SITE_OTHER): Payer: BC Managed Care – PPO | Admitting: Professional

## 2022-01-06 ENCOUNTER — Encounter: Payer: Self-pay | Admitting: Professional

## 2022-01-06 DIAGNOSIS — F4323 Adjustment disorder with mixed anxiety and depressed mood: Secondary | ICD-10-CM | POA: Diagnosis not present

## 2022-01-06 NOTE — Progress Notes (Signed)
[facility] Counselor/Therapist Progress Note  Patient ID: Vanessa Farrell, MRN: [id number],    Date: 10/28/2021  Time Spent: 46 minutes [phone]  Treatment Type: Individual Therapy  Risk Assessment: Danger to Self:  No Self-injurious Behavior: No Danger to Others: No  Subjective:  This session was held via video teletherapy. The patient consented to video teletherapy and was located at her home during this session. She is aware it is the responsibility of the patient to secure confidentiality on her end of the session. The provider was in a private home office for the duration of this session.   The patient arrived on time for her webex appointment.  Issues addressed: 1-transgender son Vanessa Farrell going to start calling her Vanessa Farrell a-pt admits she felt rejected last week when Vanessa Farrell would not tell her what type of treatment she was going to seek -pt is trying to use only she/her pronouns -pt feels like she has been grieving for a year -at bible study next week and this lady prayed over she and her spouse b-looks for people who are like-minded -she has heard her prayer for help   Treatment Plan Problems Addressed  Anxiety, Grief / Loss Unresolved, Low Self-Esteem, Sleep Disturbance  Goals 1. Begin a healthy grieving process around the loss. 2. Complete the process of letting go of the lost significant other. 3. Demonstrate improved self-esteem through more pride in appearance, more assertiveness, greater eye contact, and identification of positive traits in self-talk messages. 4. Develop a consistent, positive self-image. 5. Elevate self-esteem. 6. Enhance ability to effectively cope with the full variety of life's worries and anxieties. 7. Establish an inward sense of self-worth, confidence, and competence. 8. Interact socially without undue distress or disability. 9. Learn and implement coping skills that result in a reduction of anxiety and worry, and improved  daily functioning. 10. Reduce overall frequency, intensity, and duration of the anxiety so that daily functioning is not impaired. Objective Verbalize an understanding of the cognitive, physiological, and behavioral components of anxiety and its treatment. Target Date: 2022-06-28 Frequency: Biweekly  Progress: 80 Modality: individual  Related Interventions Discuss how generalized anxiety typically involves excessive worry about unrealistic threats, various bodily expressions of tension, overarousal, and hypervigilance, and avoidance of what is threatening that interact to maintain the problem (see Mastery of Your Anxiety and Worry: Therapist Guide by Renard Matter, and Barlow; Treating Generalized Anxiety Disorder by Rygh and Ida Rogue). Discuss how treatment targets worry, anxiety symptoms, and avoidance to help the client manage worry effectively, reduce overarousal, and eliminate unnecessary avoidance. Objective Learn and implement calming skills to reduce overall anxiety and manage anxiety symptoms. Target Date: 2022-06-28 Frequency: Biweekly  Progress: 55 Modality: individual  Related Interventions Teach the client calming/relaxation skills (e.g., applied relaxation, progressive muscle relaxation, cue controlled relaxation; mindful breathing; biofeedback) and how to discriminate better between relaxation and tension; teach the client how to apply these skills to his/her daily life (e.g., New Directions in Progressive Muscle Relaxation by Marcelyn Ditty, and Hazlett-Stevens; Treating Generalized Anxiety Disorder by Rygh and Ida Rogue). Assign the client homework each session in which he/she practices relaxation exercises daily, gradually applying them progressively from non-anxiety-provoking to anxiety-provoking situations; review and reinforce success while providing corrective feedback toward improvement. Objective Learn and implement a strategy to limit the association between various  environmental settings and worry, delaying the worry until a designated "worry time." Target Date: 2022-06-28 Frequency: Biweekly  Progress: 0 Modality: individual  Related Interventions Explain the rationale for using a worry time  as well as how it is to be used; agree upon and implement a worry time with the client. Teach the client how to recognize, stop, and postpone worry to the agreed upon worry time using skills such as thought stopping, relaxation, and redirecting attention (or assign "Making Use of the Thought-Stopping Technique" and/or "Worry Time" in the Adult Psychotherapy Homework Planner by Jongsma to assist skill development); encourage use in daily life; review and reinforce success while providing corrective feedback toward improvement. Objective Verbalize an understanding of the role that cognitive biases play in excessive irrational worry and persistent anxiety symptoms. Target Date: 2022-06-28 Frequency: Biweekly  Progress: 0 Modality: individual  Related Interventions Discuss examples demonstrating that unrealistic worry typically overestimates the probability of threats and underestimates or overlooks the client's ability to manage realistic demands (or assign "Past Successful Anxiety Coping" in the Adult Psychotherapy Homework Planner by [facility]). Assist the client in analyzing his/her worries by examining potential biases such as the probability of the negative expectation occurring, the real consequences of it occurring, his/her ability to control the outcome, the worst possible outcome, and his/her ability to accept it (see "Analyze the Probability of a Feared Event" in the Adult Psychotherapy Homework Planner by Bryn Gulling; Cognitive Therapy of Anxiety Disorders by Alison Stalling). Objective Learn and implement problem-solving strategies for realistically addressing worries. Target Date: 2022-06-28 Frequency: Biweekly  Progress: 0 Modality: individual  Related  Interventions Teach the client problem-solving strategies involving specifically defining a problem, generating options for addressing it, evaluating the pros and cons of each option, selecting and implementing an optional action, and reevaluating and refining the action (or assign "Applying Problem-Solving to Interpersonal Conflict" in the Adult Psychotherapy Homework Planner by Bryn Gulling). Assign the client a homework exercise in which he/she problem-solves a current problem (see Mastery of Your Anxiety and Worry: Workbook by Adora Fridge and Eliot Ford or Generalized Anxiety Disorder by Eather Colas, and Eliot Ford); review, reinforce success, and provide corrective feedback toward improvement. Objective Learn and implement relapse prevention strategies for managing possible future anxiety symptoms. Target Date: 2022-06-28 Frequency: Biweekly  Progress: 0 Modality: individual  Related Interventions Discuss with the client the distinction between a lapse and relapse, associating a lapse with an initial and reversible return of worry, anxiety symptoms, or urges to avoid, and relapse with the decision to continue the fearful and avoidant patterns. Identify and rehearse with the client the management of future situations or circumstances in which lapses could occur. Instruct the client to routinely use new therapeutic skills (e.g., relaxation, cognitive restructuring, exposure, and problem-solving) in daily life to address emergent worries, anxiety, and avoidant tendencies. Develop a "coping card" on which coping strategies and other important information (e.g., "Breathe deeply and relax," "Challenge unrealistic worries," "Use problem-solving") are written for the client's later use. Objective Describe situations, thoughts, feelings, and actions associated with anxieties and worries, their impact on functioning, and attempts to resolve them. Target Date: 2022-06-28 Frequency: Biweekly  Progress: 50 Modality:  individual  Related Interventions Ask the client to describe his/her past experiences of anxiety and their impact on functioning; assess the focus, excessiveness, and uncontrollability of the worry and the type, frequency, intensity, and duration of his/her anxiety symptoms (consider using a structured interview such as The Anxiety Disorders Interview Schedule-Adult Version). 11. Resolve the core conflict that is the source of anxiety. 12. Restore restful sleep pattern. 13. Stabilize anxiety level while increasing ability to function on a daily basis.  Diagnosis:Adjustment disorder with mixed anxiety and depressed mood  Plan:  -  meet again on Monday, February 03, 2022 at [facility]

## 2022-01-20 ENCOUNTER — Ambulatory Visit: Payer: BC Managed Care – PPO | Admitting: Professional

## 2022-02-03 ENCOUNTER — Ambulatory Visit: Payer: BC Managed Care – PPO | Admitting: Professional

## 2022-02-17 ENCOUNTER — Ambulatory Visit: Payer: BC Managed Care – PPO | Admitting: Professional

## 2022-03-17 ENCOUNTER — Encounter: Payer: Self-pay | Admitting: Professional

## 2022-03-17 ENCOUNTER — Ambulatory Visit (INDEPENDENT_AMBULATORY_CARE_PROVIDER_SITE_OTHER): Payer: BC Managed Care – PPO | Admitting: Professional

## 2022-03-17 DIAGNOSIS — F4323 Adjustment disorder with mixed anxiety and depressed mood: Secondary | ICD-10-CM | POA: Diagnosis not present

## 2022-03-17 NOTE — Progress Notes (Signed)
[facility] Counselor/Therapist Progress Note  Patient ID: Vanessa Farrell, MRN: [id number],    Date: 10/28/2021  Time Spent: 39 minutes [id number]  Treatment Type: Individual Therapy  Risk Assessment: Danger to Self:  No Self-injurious Behavior: No Danger to Others: No  Subjective:  This session was held via video teletherapy. The patient consented to video teletherapy and was located at her home during this session. She is aware it is the responsibility of the patient to secure confidentiality on her end of the session. The provider was in a private home office for the duration of this session.   The patient arrived late for her webex appointment.  Issues addressed: 1-family -Mike's mom had not been out of her house in five years -she spent Christmas with the patient had her family -had only two children home for Christmas 2-Andrea, transgender son who is transitioning -she is living with her first dad -she thinks her name will change -the pt and her spouse talk about Gerilyn Pilgrim when discussing the past and use [name] when referring to her in the present -most interactions guarded but positive -came over and visited with family after Christmas    Treatment Plan Problems Addressed  Anxiety, Grief / Loss Unresolved, Low Self-Esteem, Sleep Disturbance  Goals 1. Begin a healthy grieving process around the loss. 2. Complete the process of letting go of the lost significant other. 3. Demonstrate improved self-esteem through more pride in appearance, more assertiveness, greater eye contact, and identification of positive traits in self-talk messages. 4. Develop a consistent, positive self-image. 5. Elevate self-esteem. 6. Enhance ability to effectively cope with the full variety of life's worries and anxieties. 7. Establish an inward sense of self-worth, confidence, and competence. 8. Interact socially without undue distress or disability. 9. Learn and implement  coping skills that result in a reduction of anxiety and worry, and improved daily functioning. 10. Reduce overall frequency, intensity, and duration of the anxiety so that daily functioning is not impaired. Objective Verbalize an understanding of the cognitive, physiological, and behavioral components of anxiety and its treatment. Target Date: 2022-06-28 Frequency: Biweekly  Progress: 80 Modality: individual  Related Interventions Discuss how generalized anxiety typically involves excessive worry about unrealistic threats, various bodily expressions of tension, overarousal, and hypervigilance, and avoidance of what is threatening that interact to maintain the problem (see Mastery of Your Anxiety and Worry: Therapist Guide by Renard Matter, and Barlow; Treating Generalized Anxiety Disorder by Rygh and Ida Rogue). Discuss how treatment targets worry, anxiety symptoms, and avoidance to help the client manage worry effectively, reduce overarousal, and eliminate unnecessary avoidance. Objective Learn and implement calming skills to reduce overall anxiety and manage anxiety symptoms. Target Date: 2022-06-28 Frequency: Biweekly  Progress: 55 Modality: individual  Related Interventions Teach the client calming/relaxation skills (e.g., applied relaxation, progressive muscle relaxation, cue controlled relaxation; mindful breathing; biofeedback) and how to discriminate better between relaxation and tension; teach the client how to apply these skills to his/her daily life (e.g., New Directions in Progressive Muscle Relaxation by Marcelyn Ditty, and Hazlett-Stevens; Treating Generalized Anxiety Disorder by Rygh and Ida Rogue). Assign the client homework each session in which he/she practices relaxation exercises daily, gradually applying them progressively from non-anxiety-provoking to anxiety-provoking situations; review and reinforce success while providing corrective feedback toward  improvement. Objective Learn and implement a strategy to limit the association between various environmental settings and worry, delaying the worry until a designated "worry time." Target Date: 2022-06-28 Frequency: Biweekly  Progress: 0 Modality: individual  Related Interventions Explain  the rationale for using a worry time as well as how it is to be used; agree upon and implement a worry time with the client. Teach the client how to recognize, stop, and postpone worry to the agreed upon worry time using skills such as thought stopping, relaxation, and redirecting attention (or assign "Making Use of the Thought-Stopping Technique" and/or "Worry Time" in the Adult Psychotherapy Homework Planner by Jongsma to assist skill development); encourage use in daily life; review and reinforce success while providing corrective feedback toward improvement. Objective Verbalize an understanding of the role that cognitive biases play in excessive irrational worry and persistent anxiety symptoms. Target Date: 2022-06-28 Frequency: Biweekly  Progress: 0 Modality: individual  Related Interventions Discuss examples demonstrating that unrealistic worry typically overestimates the probability of threats and underestimates or overlooks the client's ability to manage realistic demands (or assign "Past Successful Anxiety Coping" in the Adult Psychotherapy Homework Planner by [facility]). Assist the client in analyzing his/her worries by examining potential biases such as the probability of the negative expectation occurring, the real consequences of it occurring, his/her ability to control the outcome, the worst possible outcome, and his/her ability to accept it (see "Analyze the Probability of a Feared Event" in the Adult Psychotherapy Homework Planner by Stephannie Li; Cognitive Therapy of Anxiety Disorders by Laurence Slate). Objective Learn and implement problem-solving strategies for realistically addressing worries. Target  Date: 2022-06-28 Frequency: Biweekly  Progress: 0 Modality: individual  Related Interventions Teach the client problem-solving strategies involving specifically defining a problem, generating options for addressing it, evaluating the pros and cons of each option, selecting and implementing an optional action, and reevaluating and refining the action (or assign "Applying Problem-Solving to Interpersonal Conflict" in the Adult Psychotherapy Homework Planner by Stephannie Li). Assign the client a homework exercise in which he/she problem-solves a current problem (see Mastery of Your Anxiety and Worry: Workbook by Elenora Fender and Filbert Schilder or Generalized Anxiety Disorder by Elesa Hacker, and Filbert Schilder); review, reinforce success, and provide corrective feedback toward improvement. Objective Learn and implement relapse prevention strategies for managing possible future anxiety symptoms. Target Date: 2022-06-28 Frequency: Biweekly  Progress: 0 Modality: individual  Related Interventions Discuss with the client the distinction between a lapse and relapse, associating a lapse with an initial and reversible return of worry, anxiety symptoms, or urges to avoid, and relapse with the decision to continue the fearful and avoidant patterns. Identify and rehearse with the client the management of future situations or circumstances in which lapses could occur. Instruct the client to routinely use new therapeutic skills (e.g., relaxation, cognitive restructuring, exposure, and problem-solving) in daily life to address emergent worries, anxiety, and avoidant tendencies. Develop a "coping card" on which coping strategies and other important information (e.g., "Breathe deeply and relax," "Challenge unrealistic worries," "Use problem-solving") are written for the client's later use. Objective Describe situations, thoughts, feelings, and actions associated with anxieties and worries, their impact on functioning, and attempts to resolve  them. Target Date: 2022-06-28 Frequency: Biweekly  Progress: 50 Modality: individual  Related Interventions Ask the client to describe his/her past experiences of anxiety and their impact on functioning; assess the focus, excessiveness, and uncontrollability of the worry and the type, frequency, intensity, and duration of his/her anxiety symptoms (consider using a structured interview such as The Anxiety Disorders Interview Schedule-Adult Version). 11. Resolve the core conflict that is the source of anxiety. 12. Restore restful sleep pattern. 13. Stabilize anxiety level while increasing ability to function on a daily basis.  Diagnosis:Adjustment disorder with mixed  anxiety and depressed mood  Plan:  -meet again as needed.
